# Patient Record
Sex: Male | Born: 1970
Health system: Southern US, Community
[De-identification: ages and names within clinical notes are randomized; demographics above are authoritative.]

## PROBLEM LIST (undated history)

## (undated) DIAGNOSIS — E785 Hyperlipidemia, unspecified: Secondary | ICD-10-CM

## (undated) DIAGNOSIS — K219 Gastro-esophageal reflux disease without esophagitis: Secondary | ICD-10-CM

## (undated) DIAGNOSIS — I10 Essential (primary) hypertension: Secondary | ICD-10-CM

## (undated) DIAGNOSIS — T7840XA Allergy, unspecified, initial encounter: Secondary | ICD-10-CM

## (undated) DIAGNOSIS — B351 Tinea unguium: Secondary | ICD-10-CM

## (undated) HISTORY — DX: Tinea unguium: B35.1

## (undated) HISTORY — DX: Gastro-esophageal reflux disease without esophagitis: K21.9

## (undated) HISTORY — DX: Allergy, unspecified, initial encounter: T78.40XA

## (undated) HISTORY — DX: Hyperlipidemia, unspecified: E78.5

## (undated) HISTORY — DX: Essential (primary) hypertension: I10

---

## 1999-08-14 ENCOUNTER — Emergency Department (HOSPITAL_COMMUNITY): Admission: EM | Admit: 1999-08-14 | Discharge: 1999-08-14 | Payer: Self-pay | Admitting: Emergency Medicine

## 1999-08-14 ENCOUNTER — Encounter: Payer: Self-pay | Admitting: Emergency Medicine

## 2002-08-24 ENCOUNTER — Encounter: Payer: Self-pay | Admitting: *Deleted

## 2002-08-24 ENCOUNTER — Emergency Department (HOSPITAL_COMMUNITY): Admission: EM | Admit: 2002-08-24 | Discharge: 2002-08-24 | Payer: Self-pay | Admitting: *Deleted

## 2005-12-13 ENCOUNTER — Ambulatory Visit: Payer: Self-pay | Admitting: Family Medicine

## 2005-12-13 LAB — CONVERTED CEMR LAB
ALT: 25 units/L (ref 0–40)
AST: 20 units/L (ref 0–37)
Albumin: 4 g/dL (ref 3.5–5.2)
Alkaline Phosphatase: 57 units/L (ref 39–117)
BUN: 10 mg/dL (ref 6–23)
Basophils Absolute: 0 10*3/uL (ref 0.0–0.1)
Basophils Relative: 0.2 % (ref 0.0–1.0)
CO2: 27 meq/L (ref 19–32)
Calcium: 9.2 mg/dL (ref 8.4–10.5)
Chloride: 104 meq/L (ref 96–112)
Chol/HDL Ratio, serum: 7.1
Cholesterol: 289 mg/dL (ref 0–200)
Creatinine, Ser: 1 mg/dL (ref 0.4–1.5)
Eosinophil percent: 1.4 % (ref 0.0–5.0)
GFR calc non Af Amer: 91 mL/min
Glomerular Filtration Rate, Af Am: 111 mL/min/{1.73_m2}
Glucose, Bld: 97 mg/dL (ref 70–99)
HCT: 46.9 % (ref 39.0–52.0)
HDL: 40.5 mg/dL (ref >39.0)
Hemoglobin: 15.4 g/dL (ref 13.0–17.0)
LDL DIRECT: 207.4 mg/dL
Lymphocytes Relative: 10.5 % — ABNORMAL LOW (ref 12.0–46.0)
MCHC: 32.9 g/dL (ref 30.0–36.0)
MCV: 89.3 fL (ref 78.0–100.0)
Monocytes Absolute: 0.6 10*3/uL (ref 0.2–0.7)
Monocytes Relative: 10.1 % (ref 3.0–11.0)
Neutro Abs: 4.8 10*3/uL (ref 1.4–7.7)
Neutrophils Relative %: 77.8 % — ABNORMAL HIGH (ref 43.0–77.0)
Platelets: 116 10*3/uL — ABNORMAL LOW (ref 150–400)
Potassium: 4 meq/L (ref 3.5–5.1)
RBC: 5.25 M/uL (ref 4.22–5.81)
RDW: 13.7 % (ref 11.5–14.6)
Sodium: 138 meq/L (ref 135–145)
TSH: 0.77 microintl units/mL (ref 0.35–5.50)
Total Bilirubin: 1 mg/dL (ref 0.3–1.2)
Total Protein: 7.8 g/dL (ref 6.0–8.3)
Triglyceride fasting, serum: 100 mg/dL (ref 0–149)
VLDL: 20 mg/dL (ref 0–40)
WBC: 6.2 10*3/uL (ref 4.5–10.5)

## 2006-08-04 ENCOUNTER — Ambulatory Visit: Payer: Self-pay | Admitting: Family Medicine

## 2006-10-16 ENCOUNTER — Encounter: Payer: Self-pay | Admitting: Family Medicine

## 2006-10-23 ENCOUNTER — Telehealth (INDEPENDENT_AMBULATORY_CARE_PROVIDER_SITE_OTHER): Payer: Self-pay | Admitting: *Deleted

## 2006-11-04 DIAGNOSIS — K219 Gastro-esophageal reflux disease without esophagitis: Secondary | ICD-10-CM | POA: Insufficient documentation

## 2006-11-13 ENCOUNTER — Telehealth: Payer: Self-pay | Admitting: Family Medicine

## 2006-12-19 ENCOUNTER — Ambulatory Visit: Payer: Self-pay | Admitting: Family Medicine

## 2006-12-19 DIAGNOSIS — J309 Allergic rhinitis, unspecified: Secondary | ICD-10-CM | POA: Insufficient documentation

## 2006-12-23 ENCOUNTER — Encounter: Payer: Self-pay | Admitting: Family Medicine

## 2006-12-23 LAB — CONVERTED CEMR LAB
BUN: 16 mg/dL (ref 6–23)
CO2: 31 meq/L (ref 19–32)
Calcium: 9.3 mg/dL (ref 8.4–10.5)
Chloride: 106 meq/L (ref 96–112)
Cholesterol: 281 mg/dL (ref 0–200)
Creatinine, Ser: 1 mg/dL (ref 0.4–1.5)
Direct LDL: 195.9 mg/dL
GFR calc Af Amer: 110 mL/min
GFR calc non Af Amer: 91 mL/min
Glucose, Bld: 94 mg/dL (ref 70–99)
HDL: 43.4 mg/dL (ref >39.0)
Potassium: 4.5 meq/L (ref 3.5–5.1)
Sodium: 141 meq/L (ref 135–145)
TSH: 0.71 microintl units/mL (ref 0.35–5.50)
Total CHOL/HDL Ratio: 6.5
Triglycerides: 152 mg/dL — ABNORMAL HIGH (ref 0–149)
VLDL: 30 mg/dL (ref 0–40)

## 2008-04-13 ENCOUNTER — Telehealth: Payer: Self-pay | Admitting: Family Medicine

## 2008-05-24 ENCOUNTER — Ambulatory Visit: Payer: Self-pay | Admitting: Family Medicine

## 2008-05-24 DIAGNOSIS — R498 Other voice and resonance disorders: Secondary | ICD-10-CM

## 2008-05-24 DIAGNOSIS — L732 Hidradenitis suppurativa: Secondary | ICD-10-CM

## 2008-05-24 DIAGNOSIS — E785 Hyperlipidemia, unspecified: Secondary | ICD-10-CM | POA: Insufficient documentation

## 2008-05-24 DIAGNOSIS — L910 Hypertrophic scar: Secondary | ICD-10-CM | POA: Insufficient documentation

## 2008-06-10 ENCOUNTER — Encounter: Payer: Self-pay | Admitting: Family Medicine

## 2010-06-21 ENCOUNTER — Telehealth: Payer: Self-pay | Admitting: *Deleted

## 2010-06-21 NOTE — Telephone Encounter (Signed)
Pt needs prescription mailed to home for CPX labs to be drawn at [facility].

## 2010-06-21 NOTE — Telephone Encounter (Signed)
done

## 2010-06-21 NOTE — Telephone Encounter (Signed)
rx mailed to home address Attempted to call mobile #--no longer in service Home # --no answer.

## 2010-06-22 ENCOUNTER — Telehealth: Payer: Self-pay | Admitting: *Deleted

## 2010-06-22 NOTE — Telephone Encounter (Signed)
ERROR

## 2010-07-10 ENCOUNTER — Encounter: Payer: Self-pay | Admitting: Family Medicine

## 2010-07-10 ENCOUNTER — Ambulatory Visit (INDEPENDENT_AMBULATORY_CARE_PROVIDER_SITE_OTHER): Payer: 59 | Admitting: Family Medicine

## 2010-07-10 VITALS — BP 122/86 | HR 72 | Temp 98.6°F | Ht 73.25 in | Wt 286.0 lb

## 2010-07-10 DIAGNOSIS — Z0000 Encounter for general adult medical examination without abnormal findings: Secondary | ICD-10-CM

## 2010-07-10 NOTE — Progress Notes (Signed)
  Subjective:    Patient ID: Reginald Simmons, male    DOB: 1971/01/02, 40 y.o.   MRN: [id number]  HPI 40 yr old male for a cpx. He feels fine except for some seasonal allergies causing itchy eyes and sneezing.    Review of Systems  Constitutional: Negative.   HENT: Negative.   Eyes: Negative.   Respiratory: Negative.   Cardiovascular: Negative.   Gastrointestinal: Negative.   Genitourinary: Negative.   Musculoskeletal: Negative.   Skin: Negative.   Neurological: Negative.   Hematological: Negative.   Psychiatric/Behavioral: Negative.        Objective:   Physical Exam  Constitutional: He is oriented to person, place, and time. He appears well-developed and well-nourished. No distress.  HENT:  Head: Normocephalic and atraumatic.  Right Ear: External ear normal.  Left Ear: External ear normal.  Nose: Nose normal.  Mouth/Throat: Oropharynx is clear and moist. No oropharyngeal exudate.  Eyes: Conjunctivae and EOM are normal. Pupils are equal, round, and reactive to light. Right eye exhibits no discharge. Left eye exhibits no discharge. No scleral icterus.  Neck: Neck supple. No JVD present. No tracheal deviation present. No thyromegaly present.  Cardiovascular: Normal rate, regular rhythm, normal heart sounds and intact distal pulses.  Exam reveals no gallop and no friction rub.   No murmur heard. Pulmonary/Chest: Effort normal and breath sounds normal. No respiratory distress. He has no wheezes. He has no rales. He exhibits no tenderness.  Abdominal: Soft. Bowel sounds are normal. He exhibits no distension and no mass. There is no tenderness. There is no rebound and no guarding.  Genitourinary: Penis normal. No penile tenderness.  Musculoskeletal: Normal range of motion. He exhibits no edema and no tenderness.  Lymphadenopathy:    He has no cervical adenopathy.  Neurological: He is alert and oriented to person, place, and time. He has normal reflexes. No cranial nerve deficit. He  exhibits normal muscle tone. Coordination normal.  Skin: Skin is warm and dry. No rash noted. He is not diaphoretic. No erythema. No pallor.  Psychiatric: He has a normal mood and affect. His behavior is normal. Judgment and thought content normal.          Assessment & Plan:  He is overweight, so we discussed diet and exercise.

## 2010-07-16 ENCOUNTER — Encounter: Payer: Self-pay | Admitting: Family Medicine

## 2010-07-20 NOTE — Assessment & Plan Note (Signed)
[facility] OFFICE NOTE   NAME:Reginald Simmons, Reginald Simmons                      MRN:          [id number]  DATE:12/13/2005                            DOB:          [birth year]    This is a 40 year old gentleman here for a complete physical examination.  Generally he is doing well and has no complaints.  He stays busy with a full-  time job and he and his wife recently had their fourth child.  I had treated  him in the past for asthma, but he says, over the past year, it really has  not bothered him at all.  He uses no inhalers.  Has no coughing.  No  wheezing.  No shortness of breath, et Karie Soda.  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  He  continues to take Allegra occasionally for allergies.  For other details of  his past medical history, family history, social history, habits, Melvern Banker,  refer to our last physical note dated June 17, 2003.   ALLERGIES:  None.   CURRENT MEDICATIONS:  Allegra 180 mg once a day as needed.   OBJECTIVE:  Height 6 feet 1 inch, weight 264, BP 138/80, pulse 76 and  regular.  GENERAL:  He remains overweight.  SKIN:  Clear.  EYES:  Clear sclerae.  OROPHARYNX: Clear.  NECK:  Supple without lymphadenopathy or masses.  LUNGS:  Clear.  CARDIAC:  Rate and rhythm are regular without gallops, murmurs, or rubs.  Distal pulses are full.  ABDOMEN:  Soft.  Normal bowel sounds.  Nontender.  No masses.  GENITALIA:  Normal male.  EXTREMITIES:  No cyanosis, clubbing, or edema.  NEUROLOGIC:  Grossly intact.   ASSESSMENT AND PLAN:  1. Complete physical.  We talked about increasing exercise and losing      weight.  He is fasting, so we will check the usual laboratories.  2. Allergies.  Stable.  3. Asthma, stable off of medications.      ______________________________  Tera Mater Clent Ridges, MD    SAF/MedQ  DD:  12/13/2005 DT:  12/15/2005 Job #:  [id number]

## 2011-06-25 ENCOUNTER — Telehealth: Payer: Self-pay | Admitting: *Deleted

## 2011-06-25 NOTE — Telephone Encounter (Signed)
Pt is complaining of dizziness, and clammy at work. Wants to make an appt with Dr. Clent Ridges.  Will make an appt but if any spells tonight, he needs to go to the ER.  Feels fine now and does not want to go to the ER unless he has to.

## 2011-06-26 ENCOUNTER — Ambulatory Visit (INDEPENDENT_AMBULATORY_CARE_PROVIDER_SITE_OTHER): Payer: 59 | Admitting: Family Medicine

## 2011-06-26 ENCOUNTER — Encounter: Payer: Self-pay | Admitting: Family Medicine

## 2011-06-26 VITALS — BP 124/70 | HR 74 | Temp 99.0°F | Wt 274.0 lb

## 2011-06-26 DIAGNOSIS — R55 Syncope and collapse: Secondary | ICD-10-CM

## 2011-06-26 LAB — HEPATIC FUNCTION PANEL
AST: 26 U/L (ref 0–37)
Alkaline Phosphatase: 49 U/L (ref 39–117)
Bilirubin, Direct: 0.1 mg/dL (ref 0.0–0.3)
Total Protein: 7.9 g/dL (ref 6.0–8.3)

## 2011-06-26 LAB — CBC WITH DIFFERENTIAL/PLATELET
Basophils Relative: 0.4 % (ref 0.0–3.0)
Eosinophils Relative: 0.9 % (ref 0.0–5.0)
HCT: 44.8 % (ref 39.0–52.0)
Lymphs Abs: 1.3 10*3/uL (ref 0.7–4.0)
MCV: 90.4 fl (ref 78.0–100.0)
Monocytes Absolute: 0.4 10*3/uL (ref 0.1–1.0)
Neutro Abs: 3.5 10*3/uL (ref 1.4–7.7)
RBC: 4.95 Mil/uL (ref 4.22–5.81)
WBC: 5.3 10*3/uL (ref 4.5–10.5)

## 2011-06-26 LAB — LIPID PANEL
Total CHOL/HDL Ratio: 4
Triglycerides: 101 mg/dL (ref 0.0–149.0)

## 2011-06-26 LAB — POCT URINALYSIS DIPSTICK
Bilirubin, UA: NEGATIVE
Blood, UA: NEGATIVE
Glucose, UA: NEGATIVE
Spec Grav, UA: >=1.03

## 2011-06-26 LAB — BASIC METABOLIC PANEL
CO2: 28 mEq/L (ref 19–32)
Calcium: 9.2 mg/dL (ref 8.4–10.5)
GFR: 111.97 mL/min (ref >60.00)
Potassium: 4 mEq/L (ref 3.5–5.1)
Sodium: 140 mEq/L (ref 135–145)

## 2011-06-26 NOTE — Telephone Encounter (Signed)
Make him an OV to see me

## 2011-06-26 NOTE — Telephone Encounter (Signed)
done

## 2011-06-26 NOTE — Progress Notes (Signed)
  Subjective:    Patient ID: Reginald Simmons, male    DOB: 16-Feb-1971, 41 y.o.   MRN: [id number]  HPI Here for an episode at work yesterday evening when he almost passed out. 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 He was standing talking to a coworker when he felt faint, sweaty, voices seemed far away, and he saw yellow spots on his vision. No HA or chest pain or SOB. After sitting down, drinking some fluids and eating a snack he felt better. He has been fine ever since. He admits to never eating breakfast and never snacks during the day.    Review of Systems  Constitutional: Positive for diaphoresis. Negative for fever and unexpected weight change.  Respiratory: Negative.   Cardiovascular: Negative.   Gastrointestinal: Negative.   Neurological: Positive for syncope, weakness and light-headedness. Negative for dizziness, tremors, seizures, speech difficulty, numbness and headaches.       Objective:   Physical Exam  Constitutional: He is oriented to person, place, and time. He appears well-developed and well-nourished.  Eyes: Conjunctivae are normal. Pupils are equal, round, and reactive to light.  Neck: No thyromegaly present.  Cardiovascular: Normal rate, regular rhythm, normal heart sounds and intact distal pulses.  Exam reveals no gallop and no friction rub.   No murmur heard. Pulmonary/Chest: Effort normal and breath sounds normal.  Lymphadenopathy:    He has no cervical adenopathy.  Neurological: He is alert and oriented to person, place, and time. He has normal reflexes. No cranial nerve deficit. He exhibits normal muscle tone. Coordination normal.          Assessment & Plan:  It sounds like he a vagal event. I advised him to drink plenty of fluids. He is to eat breakfast every day and have frequent snacks. Get labs today

## 2011-07-01 ENCOUNTER — Encounter: Payer: Self-pay | Admitting: Family Medicine

## 2011-07-01 MED ORDER — ATORVASTATIN CALCIUM 20 MG PO TABS: 20.0000 mg | ORAL_TABLET | Freq: Every day | ORAL | Status: DC

## 2011-07-01 NOTE — Progress Notes (Signed)
Quick Note:  I tried to reach pt by phone, no answer and I did send script e-scribe. ______

## 2011-07-01 NOTE — Progress Notes (Signed)
Addended by: Aniceto Boss A on: 07/01/2011 11:39 AM   Modules accepted: Orders

## 2011-07-18 ENCOUNTER — Encounter: Payer: 59 | Admitting: Family Medicine

## 2011-07-19 ENCOUNTER — Other Ambulatory Visit: Payer: 59

## 2011-07-26 ENCOUNTER — Ambulatory Visit (INDEPENDENT_AMBULATORY_CARE_PROVIDER_SITE_OTHER): Payer: 59 | Admitting: Family Medicine

## 2011-07-26 ENCOUNTER — Encounter: Payer: Self-pay | Admitting: Family Medicine

## 2011-07-26 VITALS — BP 124/70 | HR 77 | Temp 99.0°F | Ht 73.0 in | Wt 270.0 lb

## 2011-07-26 DIAGNOSIS — Z0000 Encounter for general adult medical examination without abnormal findings: Secondary | ICD-10-CM

## 2011-07-26 NOTE — Progress Notes (Signed)
  Subjective:    Patient ID: Reginald Simmons, male    DOB: 1971-02-16, 41 y.o.   MRN: [id number]  HPI 41 yr old male for a cpx. He feels fine with no concerns. We had planned to start him on Lipitor for an LDL of 189, but he did not want to fill this. Instead he has made a serious commitment to get healthier. He is exercising and has made major changes in his diet. He has lost 4 lbs in the past week.    Review of Systems  Constitutional: Negative.   HENT: Negative.   Eyes: Negative.   Respiratory: Negative.   Cardiovascular: Negative.   Gastrointestinal: Negative.   Genitourinary: Negative.   Musculoskeletal: Negative.   Skin: Negative.   Neurological: Negative.   Hematological: Negative.   Psychiatric/Behavioral: Negative.        Objective:   Physical Exam  Constitutional: He is oriented to person, place, and time. He appears well-developed and well-nourished. No distress.  HENT:  Head: Normocephalic and atraumatic.  Right Ear: External ear normal.  Left Ear: External ear normal.  Nose: Nose normal.  Mouth/Throat: Oropharynx is clear and moist. No oropharyngeal exudate.  Eyes: Conjunctivae and EOM are normal. Pupils are equal, round, and reactive to light. Right eye exhibits no discharge. Left eye exhibits no discharge. No scleral icterus.  Neck: Neck supple. No JVD present. No tracheal deviation present. No thyromegaly present.  Cardiovascular: Normal rate, regular rhythm, normal heart sounds and intact distal pulses.  Exam reveals no gallop and no friction rub.   No murmur heard. Pulmonary/Chest: Effort normal and breath sounds normal. No respiratory distress. He has no wheezes. He has no rales. He exhibits no tenderness.  Abdominal: Soft. Bowel sounds are normal. He exhibits no distension and no mass. There is no tenderness. There is no rebound and no guarding.  Genitourinary: Rectum normal, prostate normal and penis normal. Guaiac negative stool. No penile tenderness.    Musculoskeletal: Normal range of motion. He exhibits no edema and no tenderness.  Lymphadenopathy:    He has no cervical adenopathy.  Neurological: He is alert and oriented to person, place, and time. He has normal reflexes. No cranial nerve deficit. He exhibits normal muscle tone. Coordination normal.  Skin: Skin is warm and dry. No rash noted. He is not diaphoretic. No erythema. No pallor.  Psychiatric: He has a normal mood and affect. His behavior is normal. Judgment and thought content normal.          Assessment & Plan:  Well exam. I encouraged him to stick with his new regimen. Recheck lipids in 6 months

## 2012-01-24 ENCOUNTER — Ambulatory Visit: Payer: 59 | Admitting: Family Medicine

## 2012-01-28 ENCOUNTER — Encounter: Payer: Self-pay | Admitting: Family Medicine

## 2012-01-28 ENCOUNTER — Ambulatory Visit (INDEPENDENT_AMBULATORY_CARE_PROVIDER_SITE_OTHER): Payer: 59 | Admitting: Family Medicine

## 2012-01-28 VITALS — BP 124/80 | HR 64 | Temp 98.4°F | Wt 266.0 lb

## 2012-01-28 DIAGNOSIS — E785 Hyperlipidemia, unspecified: Secondary | ICD-10-CM

## 2012-01-28 NOTE — Progress Notes (Signed)
  Subjective:    Patient ID: KA FLAMMER, male    DOB: [birth year], 41 y.o.   MRN: [id number]  HPI Here to follow up on high lipids. For the past 6 months he has adjusted his diet and has exercised. He had lost about 15 lbs but he has gained some of that back. Today he is 4 lbs less than he was at the last visit here. He feels better and has more energy.    Review of Systems  Constitutional: Negative.   Respiratory: Negative.   Cardiovascular: Negative.        Objective:   Physical Exam  Constitutional: He appears well-developed and well-nourished.  Cardiovascular: Normal rate, regular rhythm, normal heart sounds and intact distal pulses.   Pulmonary/Chest: Effort normal and breath sounds normal.          Assessment & Plan:  We will set up fasting labs soon.

## 2012-01-31 ENCOUNTER — Other Ambulatory Visit (INDEPENDENT_AMBULATORY_CARE_PROVIDER_SITE_OTHER): Payer: 59

## 2012-01-31 DIAGNOSIS — E785 Hyperlipidemia, unspecified: Secondary | ICD-10-CM

## 2012-01-31 LAB — LIPID PANEL
Total CHOL/HDL Ratio: 4
VLDL: 20.4 mg/dL (ref 0.0–40.0)

## 2012-02-06 MED ORDER — ATORVASTATIN CALCIUM 20 MG PO TABS: 20.0000 mg | ORAL_TABLET | Freq: Every day | ORAL | Status: DC

## 2012-02-06 NOTE — Progress Notes (Signed)
Quick Note:  I tried to reach pt on cell & home and no answer or option to leave a message. I sent script e-scribe and put a copy of results in mail. ______

## 2012-09-18 ENCOUNTER — Other Ambulatory Visit (INDEPENDENT_AMBULATORY_CARE_PROVIDER_SITE_OTHER): Payer: 59

## 2012-09-18 DIAGNOSIS — Z0000 Encounter for general adult medical examination without abnormal findings: Secondary | ICD-10-CM

## 2012-09-18 DIAGNOSIS — E785 Hyperlipidemia, unspecified: Secondary | ICD-10-CM

## 2012-09-18 LAB — BASIC METABOLIC PANEL
CO2: 27 mEq/L (ref 19–32)
Chloride: 104 mEq/L (ref 96–112)
Creatinine, Ser: 0.9 mg/dL (ref 0.4–1.5)
Potassium: 4.8 mEq/L (ref 3.5–5.1)
Sodium: 138 mEq/L (ref 135–145)

## 2012-09-18 LAB — CBC WITH DIFFERENTIAL/PLATELET
Basophils Relative: 0.2 % (ref 0.0–3.0)
Eosinophils Absolute: 0.1 10*3/uL (ref 0.0–0.7)
Eosinophils Relative: 1.9 % (ref 0.0–5.0)
HCT: 44 % (ref 39.0–52.0)
Hemoglobin: 14.6 g/dL (ref 13.0–17.0)
MCHC: 33.2 g/dL (ref 30.0–36.0)
MCV: 90.2 fl (ref 78.0–100.0)
Monocytes Absolute: 0.4 10*3/uL (ref 0.1–1.0)
Neutro Abs: 2.5 10*3/uL (ref 1.4–7.7)
Neutrophils Relative %: 59.1 % (ref 43.0–77.0)
RBC: 4.88 Mil/uL (ref 4.22–5.81)
WBC: 4.2 10*3/uL — ABNORMAL LOW (ref 4.5–10.5)

## 2012-09-18 LAB — LIPID PANEL
Total CHOL/HDL Ratio: 5
Triglycerides: 78 mg/dL (ref 0.0–149.0)

## 2012-09-18 LAB — POCT URINALYSIS DIPSTICK
Bilirubin, UA: NEGATIVE
Blood, UA: NEGATIVE
Glucose, UA: NEGATIVE
Ketones, UA: NEGATIVE
Leukocytes, UA: NEGATIVE
Nitrite, UA: NEGATIVE

## 2012-09-18 LAB — HEPATIC FUNCTION PANEL
AST: 30 U/L (ref 0–37)
Bilirubin, Direct: 0.1 mg/dL (ref 0.0–0.3)
Total Bilirubin: 0.7 mg/dL (ref 0.3–1.2)

## 2012-09-18 LAB — TSH: TSH: 1.33 u[IU]/mL (ref 0.35–5.50)

## 2012-09-21 NOTE — Progress Notes (Signed)
Quick Note:  I tried to reach pt and no answer. Pt has appointment on 09/30/12 will go over then. ______

## 2012-09-30 ENCOUNTER — Ambulatory Visit (INDEPENDENT_AMBULATORY_CARE_PROVIDER_SITE_OTHER): Payer: 59 | Admitting: Family Medicine

## 2012-09-30 ENCOUNTER — Encounter: Payer: Self-pay | Admitting: Family Medicine

## 2012-09-30 VITALS — BP 120/74 | HR 84 | Temp 98.2°F | Ht 72.5 in | Wt 286.0 lb

## 2012-09-30 DIAGNOSIS — Z0000 Encounter for general adult medical examination without abnormal findings: Secondary | ICD-10-CM

## 2012-09-30 LAB — PSA: PSA: 4.08 ng/mL — ABNORMAL HIGH (ref 0.10–4.00)

## 2012-09-30 MED ORDER — MINOCYCLINE HCL 100 MG PO CAPS: 100.0000 mg | ORAL_CAPSULE | Freq: Two times a day (BID) | ORAL | Status: DC

## 2012-09-30 MED ORDER — ATORVASTATIN CALCIUM 20 MG PO TABS: 20.0000 mg | ORAL_TABLET | Freq: Every day | ORAL | Status: DC

## 2012-09-30 NOTE — Progress Notes (Signed)
  Subjective:    Patient ID: Reginald Simmons, male    DOB: 25-Jul-1970, 42 y.o.   MRN: [id number]  HPI 42 yr old male for a cpx. He feels well in general but he does ask about frequent small boils he gets on his skin. These can come up anywhere on his body and have been present for about 5 years. They get a little tender,open and drain, and then go down over the course of a week or two. He had been on Lipitor but stopped taking this last winter because he wanted to try diet alone. Predictably his LDL has jumped up dramatically.    Review of Systems  Constitutional: Negative.   HENT: Negative.   Eyes: Negative.   Respiratory: Negative.   Cardiovascular: Negative.   Gastrointestinal: Negative.   Genitourinary: Negative.   Musculoskeletal: Negative.   Skin: Negative.   Neurological: Negative.   Psychiatric/Behavioral: Negative.        Objective:   Physical Exam  Constitutional: He is oriented to person, place, and time. He appears well-developed and well-nourished. No distress.  HENT:  Head: Normocephalic and atraumatic.  Right Ear: External ear normal.  Left Ear: External ear normal.  Nose: Nose normal.  Mouth/Throat: Oropharynx is clear and moist. No oropharyngeal exudate.  Eyes: Conjunctivae and EOM are normal. Pupils are equal, round, and reactive to light. Right eye exhibits no discharge. Left eye exhibits no discharge. No scleral icterus.  Neck: Neck supple. No JVD present. No tracheal deviation present. No thyromegaly present.  Cardiovascular: Normal rate, regular rhythm, normal heart sounds and intact distal pulses.  Exam reveals no gallop and no friction rub.   No murmur heard. Pulmonary/Chest: Effort normal and breath sounds normal. No respiratory distress. He has no wheezes. He has no rales. He exhibits no tenderness.  Abdominal: Soft. Bowel sounds are normal. He exhibits no distension and no mass. There is no tenderness. There is no rebound and no guarding.  Genitourinary:  Rectum normal, prostate normal and penis normal. Guaiac negative stool. No penile tenderness.  Musculoskeletal: Normal range of motion. He exhibits no edema and no tenderness.  Lymphadenopathy:    He has no cervical adenopathy.  Neurological: He is alert and oriented to person, place, and time. He has normal reflexes. No cranial nerve deficit. He exhibits normal muscle tone. Coordination normal.  Skin: Skin is warm and dry. No rash noted. He is not diaphoretic. No erythema. No pallor.  Psychiatric: He has a normal mood and affect. His behavior is normal. Judgment and thought content normal.          Assessment & Plan:  Well exam. Start on Minocycline 100 mg bid for the skin. Get back on Lipitor and we will recheck labs in 90 days. Get a PSA today

## 2012-10-02 NOTE — Progress Notes (Signed)
Quick Note:  I left voice message with results. ______ 

## 2013-03-19 ENCOUNTER — Ambulatory Visit (INDEPENDENT_AMBULATORY_CARE_PROVIDER_SITE_OTHER): Payer: 59 | Admitting: Family Medicine

## 2013-03-19 ENCOUNTER — Encounter: Payer: Self-pay | Admitting: Family Medicine

## 2013-03-19 VITALS — BP 124/80 | HR 69 | Temp 98.4°F | Ht 72.5 in | Wt 290.0 lb

## 2013-03-19 DIAGNOSIS — Z202 Contact with and (suspected) exposure to infections with a predominantly sexual mode of transmission: Secondary | ICD-10-CM

## 2013-03-19 NOTE — Progress Notes (Signed)
Pre visit review using our clinic review tool, if applicable. No additional management support is needed unless otherwise documented below in the visit note. 

## 2013-03-19 NOTE — Progress Notes (Signed)
   Subjective:    Patient ID: Reginald Simmons, male    DOB: 07/05/1970, 43 y.o.   MRN: [id number]  HPI Here with concerns about possible STD exposure. He feels great and has no sx.    Review of Systems  Constitutional: Negative.   Respiratory: Negative.   Cardiovascular: Negative.   Genitourinary: Negative.        Objective:   Physical Exam  Constitutional: He appears well-developed and well-nourished.          Assessment & Plan:  We will test as below.

## 2013-03-20 LAB — GC/CHLAMYDIA PROBE AMP, URINE
CHLAMYDIA, SWAB/URINE, PCR: NEGATIVE
GC Probe Amp, Urine: NEGATIVE

## 2013-03-20 LAB — RPR

## 2013-03-20 LAB — HIV ANTIBODY (ROUTINE TESTING W REFLEX): HIV: NONREACTIVE

## 2013-03-22 LAB — HSV(HERPES SIMPLEX VRS) I + II AB-IGG
HSV 1 GLYCOPROTEIN G AB, IGG: 0.2 IV
HSV 2 Glycoprotein G Ab, IgG: <0.1 IV

## 2013-08-30 ENCOUNTER — Encounter: Payer: Self-pay | Admitting: Family Medicine

## 2013-08-30 ENCOUNTER — Ambulatory Visit (INDEPENDENT_AMBULATORY_CARE_PROVIDER_SITE_OTHER): Payer: 59 | Admitting: Family Medicine

## 2013-08-30 VITALS — BP 122/90 | HR 83 | Temp 98.9°F | Ht 72.5 in | Wt 286.0 lb

## 2013-08-30 DIAGNOSIS — R053 Chronic cough: Secondary | ICD-10-CM

## 2013-08-30 DIAGNOSIS — R05 Cough: Secondary | ICD-10-CM

## 2013-08-30 DIAGNOSIS — R059 Cough, unspecified: Secondary | ICD-10-CM

## 2013-08-30 MED ORDER — BECLOMETHASONE DIPROPIONATE 80 MCG/ACT IN AERS: 2.0000 | INHALATION_SPRAY | Freq: Two times a day (BID) | RESPIRATORY_TRACT | Status: DC

## 2013-08-30 NOTE — Progress Notes (Signed)
   Subjective:    Patient ID: Reginald Simmons, male    DOB: [birth year], 43 y.o.   MRN: [id number]  HPI Here for a 2 month hx of coughing, which is a dry hacking cough. Sometimes he gets hoarse with this and sometimes he gets mildly SOB. No chest pain. No swelling in the hands or feet. No GERD sx. 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 However he feels certain that breathing these fumes is the problem. He has applied to other jobs and has put in for a transfer with his company. He has never smoked.    Review of Systems  Constitutional: Negative.   Respiratory: Positive for cough, choking, chest tightness and shortness of breath. Negative for wheezing.   Cardiovascular: Negative.        Objective:   Physical Exam  Constitutional: He appears well-developed and well-nourished.  Neck: No thyromegaly present.  Cardiovascular: Normal rate, regular rhythm, normal heart sounds and intact distal pulses.   Pulmonary/Chest: Effort normal and breath sounds normal. No respiratory distress. He has no wheezes. He has no rales.  Lymphadenopathy:    He has no cervical adenopathy.          Assessment & Plan:  He may be correct about the welding fumes so I agree with trying to find another job. In the meantime try QVar bid. Get a CXR.

## 2013-08-30 NOTE — Progress Notes (Signed)
Pre visit review using our clinic review tool, if applicable. No additional management support is needed unless otherwise documented below in the visit note. 

## 2013-09-02 ENCOUNTER — Ambulatory Visit (INDEPENDENT_AMBULATORY_CARE_PROVIDER_SITE_OTHER)
Admission: RE | Admit: 2013-09-02 | Discharge: 2013-09-02 | Disposition: A | Discharge status: Home / Self Care | Payer: 59 | Source: Ambulatory Visit | Attending: Family Medicine | Admitting: Family Medicine

## 2013-09-02 DIAGNOSIS — R059 Cough, unspecified: Secondary | ICD-10-CM

## 2013-09-02 DIAGNOSIS — R05 Cough: Secondary | ICD-10-CM

## 2013-09-02 DIAGNOSIS — R053 Chronic cough: Secondary | ICD-10-CM

## 2013-09-24 ENCOUNTER — Other Ambulatory Visit: Payer: 59

## 2013-10-01 ENCOUNTER — Encounter: Payer: Self-pay | Admitting: Family Medicine

## 2013-10-01 ENCOUNTER — Ambulatory Visit (INDEPENDENT_AMBULATORY_CARE_PROVIDER_SITE_OTHER): Payer: 59 | Admitting: Family Medicine

## 2013-10-01 ENCOUNTER — Other Ambulatory Visit: Payer: 59

## 2013-10-01 VITALS — BP 120/70 | Temp 98.7°F | Ht 73.0 in | Wt 280.0 lb

## 2013-10-01 DIAGNOSIS — Z0000 Encounter for general adult medical examination without abnormal findings: Secondary | ICD-10-CM

## 2013-10-01 LAB — BASIC METABOLIC PANEL
BUN: 19 mg/dL (ref 6–23)
CO2: 27 mEq/L (ref 19–32)
CREATININE: 0.9 mg/dL (ref 0.4–1.5)
Calcium: 8.8 mg/dL (ref 8.4–10.5)
Chloride: 104 mEq/L (ref 96–112)
GFR: 113.42 mL/min (ref >60.00)
GLUCOSE: 87 mg/dL (ref 70–99)
Potassium: 3.7 mEq/L (ref 3.5–5.1)
Sodium: 138 mEq/L (ref 135–145)

## 2013-10-01 LAB — LIPID PANEL
Cholesterol: 241 mg/dL — ABNORMAL HIGH (ref 0–200)
HDL: 50.6 mg/dL (ref >39.00)
LDL Cholesterol: 174 mg/dL — ABNORMAL HIGH (ref 0–99)
NonHDL: 190.4
Total CHOL/HDL Ratio: 5
Triglycerides: 80 mg/dL (ref 0.0–149.0)
VLDL: 16 mg/dL (ref 0.0–40.0)

## 2013-10-01 LAB — HEPATIC FUNCTION PANEL
ALBUMIN: 3.8 g/dL (ref 3.5–5.2)
ALK PHOS: 53 U/L (ref 39–117)
ALT: 27 U/L (ref 0–53)
AST: 21 U/L (ref 0–37)
Bilirubin, Direct: 0.1 mg/dL (ref 0.0–0.3)
TOTAL PROTEIN: 7.1 g/dL (ref 6.0–8.3)
Total Bilirubin: 0.5 mg/dL (ref 0.2–1.2)

## 2013-10-01 LAB — POCT URINALYSIS DIPSTICK
BILIRUBIN UA: NEGATIVE
Blood, UA: NEGATIVE
Glucose, UA: NEGATIVE
Ketones, UA: NEGATIVE
LEUKOCYTES UA: NEGATIVE
NITRITE UA: NEGATIVE
Protein, UA: NEGATIVE
Spec Grav, UA: 1.02
Urobilinogen, UA: 0.2
pH, UA: 5.5

## 2013-10-01 LAB — PSA: PSA: 3.23 ng/mL (ref 0.10–4.00)

## 2013-10-01 LAB — TSH: TSH: 1.42 u[IU]/mL (ref 0.35–4.50)

## 2013-10-01 NOTE — Progress Notes (Signed)
Pre visit review using our clinic review tool, if applicable. No additional management support is needed unless otherwise documented below in the visit note. 

## 2013-10-01 NOTE — Progress Notes (Signed)
   Subjective:    Patient ID: Reginald Simmons, male    DOB: 1970-12-20, 43 y.o.   MRN: [id number]  HPI 43 yr old male for a cpx. He feels well. 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    Review of Systems  Constitutional: Negative.   HENT: Negative.   Eyes: Negative.   Respiratory: Negative.   Cardiovascular: Negative.   Gastrointestinal: Negative.   Genitourinary: Negative.   Musculoskeletal: Negative.   Skin: Negative.   Neurological: Negative.   Psychiatric/Behavioral: Negative.        Objective:   Physical Exam  Constitutional: He is oriented to person, place, and time. He appears well-developed and well-nourished. No distress.  HENT:  Head: Normocephalic and atraumatic.  Right Ear: External ear normal.  Left Ear: External ear normal.  Nose: Nose normal.  Mouth/Throat: Oropharynx is clear and moist. No oropharyngeal exudate.  Eyes: Conjunctivae and EOM are normal. Pupils are equal, round, and reactive to light. Right eye exhibits no discharge. Left eye exhibits no discharge. No scleral icterus.  Neck: Neck supple. No JVD present. No tracheal deviation present. No thyromegaly present.  Cardiovascular: Normal rate, regular rhythm, normal heart sounds and intact distal pulses.  Exam reveals no gallop and no friction rub.   No murmur heard. Pulmonary/Chest: Effort normal and breath sounds normal. No respiratory distress. He has no wheezes. He has no rales. He exhibits no tenderness.  Abdominal: Soft. Bowel sounds are normal. He exhibits no distension and no mass. There is no tenderness. There is no rebound and no guarding.  Genitourinary: Rectum normal, prostate normal and penis normal. Guaiac negative stool. No penile tenderness.  Musculoskeletal: Normal range of motion. He exhibits no edema and no tenderness.  Lymphadenopathy:    He has no cervical adenopathy.  Neurological: He is alert and oriented to person, place, and time. He  has normal reflexes. No cranial nerve deficit. He exhibits normal muscle tone. Coordination normal.  Skin: Skin is warm and dry. No rash noted. He is not diaphoretic. No erythema. No pallor.  Psychiatric: He has a normal mood and affect. His behavior is normal. Judgment and thought content normal.          Assessment & Plan:  Well exam.

## 2013-10-01 NOTE — Addendum Note (Signed)
Addended by: Joyce Gross R on: 10/01/2013 11:39 AM   Modules accepted: Orders

## 2014-04-28 ENCOUNTER — Other Ambulatory Visit: Payer: Self-pay | Admitting: Family Medicine

## 2014-04-29 ENCOUNTER — Ambulatory Visit (INDEPENDENT_AMBULATORY_CARE_PROVIDER_SITE_OTHER): Payer: 59 | Admitting: Family Medicine

## 2014-04-29 ENCOUNTER — Encounter: Payer: Self-pay | Admitting: Family Medicine

## 2014-04-29 VITALS — BP 128/70 | HR 68 | Temp 98.6°F | Ht 73.0 in | Wt 289.0 lb

## 2014-04-29 DIAGNOSIS — L0293 Carbuncle, unspecified: Secondary | ICD-10-CM

## 2014-04-29 MED ORDER — DOXYCYCLINE HYCLATE 100 MG PO CAPS: 100.0000 mg | ORAL_CAPSULE | Freq: Two times a day (BID) | ORAL | Status: DC

## 2014-04-29 NOTE — Progress Notes (Signed)
Pre visit review using our clinic review tool, if applicable. No additional management support is needed unless otherwise documented below in the visit note. 

## 2014-04-29 NOTE — Progress Notes (Signed)
   Subjective:    Patient ID: Reginald Simmons, male    DOB: 03/26/1970, 44 y.o.   MRN: [id number]  HPI Here for several boils that came up recently. He had one on the abdomen a few weeks ago and this week he had one on the scalp. They usually open and drain, and then go back down. He has used Minopcycline in the past on a prn basis but has never taken anything on a maintenance basis.    Review of Systems  Constitutional: Negative.        Objective:   Physical Exam  Constitutional: He appears well-developed and well-nourished.  Lymphadenopathy:    He has no cervical adenopathy.  Skin:  Cystic lesion on the left parietal scalp which has drained and is now flat           Assessment & Plan:  We will start him on prophylactic Doxycycline 100 mg bid for the next 90 days. Recheck prn

## 2014-09-30 ENCOUNTER — Other Ambulatory Visit (INDEPENDENT_AMBULATORY_CARE_PROVIDER_SITE_OTHER): Payer: 59

## 2014-09-30 DIAGNOSIS — Z0000 Encounter for general adult medical examination without abnormal findings: Secondary | ICD-10-CM

## 2014-09-30 LAB — LIPID PANEL
CHOL/HDL RATIO: 5
CHOLESTEROL: 265 mg/dL — AB (ref 0–200)
HDL: 52.2 mg/dL (ref >39.00)
LDL Cholesterol: 193 mg/dL — ABNORMAL HIGH (ref 0–99)
NONHDL: 213.16
Triglycerides: 101 mg/dL (ref 0.0–149.0)
VLDL: 20.2 mg/dL (ref 0.0–40.0)

## 2014-09-30 LAB — POCT URINALYSIS DIPSTICK
Bilirubin, UA: NEGATIVE
GLUCOSE UA: NEGATIVE
Ketones, UA: NEGATIVE
Leukocytes, UA: NEGATIVE
Nitrite, UA: NEGATIVE
PH UA: 6
Protein, UA: NEGATIVE
RBC UA: NEGATIVE
SPEC GRAV UA: 1.025
Urobilinogen, UA: 0.2

## 2014-09-30 LAB — BASIC METABOLIC PANEL
BUN: 16 mg/dL (ref 6–23)
CALCIUM: 8.9 mg/dL (ref 8.4–10.5)
CO2: 27 mEq/L (ref 19–32)
CREATININE: 0.92 mg/dL (ref 0.40–1.50)
Chloride: 105 mEq/L (ref 96–112)
GFR: 115.72 mL/min (ref >60.00)
GLUCOSE: 99 mg/dL (ref 70–99)
Potassium: 3.9 mEq/L (ref 3.5–5.1)
Sodium: 139 mEq/L (ref 135–145)

## 2014-09-30 LAB — HEPATIC FUNCTION PANEL
ALT: 31 U/L (ref 0–53)
AST: 22 U/L (ref 0–37)
Albumin: 4.1 g/dL (ref 3.5–5.2)
Alkaline Phosphatase: 63 U/L (ref 39–117)
BILIRUBIN TOTAL: 0.7 mg/dL (ref 0.2–1.2)
Bilirubin, Direct: 0.1 mg/dL (ref 0.0–0.3)
Total Protein: 7.3 g/dL (ref 6.0–8.3)

## 2014-09-30 LAB — CBC WITH DIFFERENTIAL/PLATELET
BASOS ABS: 0 10*3/uL (ref 0.0–0.1)
BASOS PCT: 0.4 % (ref 0.0–3.0)
EOS PCT: 1.3 % (ref 0.0–5.0)
Eosinophils Absolute: 0.1 10*3/uL (ref 0.0–0.7)
HCT: 46.1 % (ref 39.0–52.0)
Hemoglobin: 15.3 g/dL (ref 13.0–17.0)
LYMPHS PCT: 26.4 % (ref 12.0–46.0)
Lymphs Abs: 1.2 10*3/uL (ref 0.7–4.0)
MCHC: 33.1 g/dL (ref 30.0–36.0)
MCV: 89.2 fl (ref 78.0–100.0)
MONOS PCT: 11 % (ref 3.0–12.0)
Monocytes Absolute: 0.5 10*3/uL (ref 0.1–1.0)
Neutro Abs: 2.7 10*3/uL (ref 1.4–7.7)
Neutrophils Relative %: 60.9 % (ref 43.0–77.0)
Platelets: 132 10*3/uL — ABNORMAL LOW (ref 150.0–400.0)
RBC: 5.17 Mil/uL (ref 4.22–5.81)
RDW: 14.7 % (ref 11.5–15.5)
WBC: 4.4 10*3/uL (ref 4.0–10.5)

## 2014-09-30 LAB — TSH: TSH: 0.84 u[IU]/mL (ref 0.35–4.50)

## 2014-09-30 LAB — PSA: PSA: 2.72 ng/mL (ref 0.10–4.00)

## 2014-10-05 MED ORDER — ATORVASTATIN CALCIUM 20 MG PO TABS
20.0000 mg | ORAL_TABLET | Freq: Every day | ORAL | Status: DC
Start: 2014-10-05 — End: 2016-11-01

## 2014-10-05 NOTE — Addendum Note (Signed)
Addended by: Colleen Can on: 10/05/2014 02:57 PM   Modules accepted: Orders

## 2014-10-07 ENCOUNTER — Encounter: Payer: 59 | Admitting: Family Medicine

## 2014-10-21 ENCOUNTER — Ambulatory Visit (INDEPENDENT_AMBULATORY_CARE_PROVIDER_SITE_OTHER): Payer: 59 | Admitting: Family Medicine

## 2014-10-21 ENCOUNTER — Encounter: Payer: Self-pay | Admitting: Family Medicine

## 2014-10-21 VITALS — BP 120/80 | HR 66 | Temp 98.8°F | Ht 72.75 in | Wt 290.0 lb

## 2014-10-21 DIAGNOSIS — L723 Sebaceous cyst: Secondary | ICD-10-CM

## 2014-10-21 DIAGNOSIS — Z23 Encounter for immunization: Secondary | ICD-10-CM

## 2014-10-21 DIAGNOSIS — Z0000 Encounter for general adult medical examination without abnormal findings: Secondary | ICD-10-CM

## 2014-10-21 DIAGNOSIS — E785 Hyperlipidemia, unspecified: Secondary | ICD-10-CM | POA: Diagnosis not present

## 2014-10-21 NOTE — Progress Notes (Signed)
   Subjective:    Patient ID: Reginald Simmons, male    DOB: 1970-04-21, 44 y.o.   MRN: [id number]  HPI 44 yr old male for a cpx. He feels well but he does want to take care of the cyst on is scalp. He has had a sebaceous cyst on the scalp for several years and this occasionally gets infected. It drains pus and has an odor, then it closes up again. Otherwise he has started on Lipitor for his cholesterol. 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    Review of Systems  Constitutional: Negative.   HENT: Negative.   Eyes: Negative.   Respiratory: Negative.   Cardiovascular: Negative.   Gastrointestinal: Negative.   Genitourinary: Negative.   Musculoskeletal: Negative.   Skin: Negative.   Neurological: Negative.   Psychiatric/Behavioral: Negative.        Objective:   Physical Exam  Constitutional: He is oriented to person, place, and time. He appears well-developed and well-nourished. No distress.  HENT:  Head: Normocephalic and atraumatic.  Right Ear: External ear normal.  Left Ear: External ear normal.  Nose: Nose normal.  Mouth/Throat: Oropharynx is clear and moist. No oropharyngeal exudate.  Eyes: Conjunctivae and EOM are normal. Pupils are equal, round, and reactive to light. Right eye exhibits no discharge. Left eye exhibits no discharge. No scleral icterus.  Neck: Neck supple. No JVD present. No tracheal deviation present. No thyromegaly present.  Cardiovascular: Normal rate, regular rhythm, normal heart sounds and intact distal pulses.  Exam reveals no gallop and no friction rub.   No murmur heard. Pulmonary/Chest: Effort normal and breath sounds normal. No respiratory distress. He has no wheezes. He has no rales. He exhibits no tenderness.  Abdominal: Soft. Bowel sounds are normal. He exhibits no distension and no mass. There is no tenderness. There is no rebound and no guarding.  Genitourinary: Rectum normal,  prostate normal and penis normal. Guaiac negative stool. No penile tenderness.  Musculoskeletal: Normal range of motion. He exhibits no edema or tenderness.  Lymphadenopathy:    He has no cervical adenopathy.  Neurological: He is alert and oriented to person, place, and time. He has normal reflexes. No cranial nerve deficit. He exhibits normal muscle tone. Coordination normal.  Skin: Skin is warm and dry. No rash noted. He is not diaphoretic. No erythema. No pallor.  There is a large non-tender sebaceous cyst on the left parietal scalp   Psychiatric: He has a normal mood and affect. His behavior is normal. Judgment and thought content normal.          Assessment & Plan:  Well exam. We discussed diet and exercise advice so he can lose weight. We will recheck his lipids in 90 days. Refer to Dermatology to remove the scalp cyst.

## 2014-10-21 NOTE — Progress Notes (Signed)
Pre visit review using our clinic review tool, if applicable. No additional management support is needed unless otherwise documented below in the visit note. 

## 2015-06-02 ENCOUNTER — Telehealth: Payer: Self-pay | Admitting: Family Medicine

## 2015-06-05 ENCOUNTER — Telehealth: Payer: Self-pay | Admitting: Family Medicine

## 2015-06-05 ENCOUNTER — Encounter: Payer: Self-pay | Admitting: Family Medicine

## 2015-06-05 ENCOUNTER — Ambulatory Visit (INDEPENDENT_AMBULATORY_CARE_PROVIDER_SITE_OTHER): Payer: 59 | Admitting: Family Medicine

## 2015-06-05 VITALS — BP 110/80 | HR 79 | Temp 99.5°F | Ht 72.75 in | Wt 303.0 lb

## 2015-06-05 DIAGNOSIS — S161XXA Strain of muscle, fascia and tendon at neck level, initial encounter: Secondary | ICD-10-CM | POA: Diagnosis not present

## 2015-06-05 MED ORDER — HYDROCODONE-ACETAMINOPHEN 10-325 MG PO TABS: 1.0000 | ORAL_TABLET | Freq: Three times a day (TID) | ORAL | Status: DC | PRN

## 2015-06-05 MED ORDER — CYCLOBENZAPRINE HCL 10 MG PO TABS: 10.0000 mg | ORAL_TABLET | Freq: Three times a day (TID) | ORAL | Status: DC | PRN

## 2015-06-05 MED ORDER — PREDNISONE 10 MG PO TABS: ORAL_TABLET | ORAL | Status: DC
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
## 2015-06-05 NOTE — Telephone Encounter (Signed)
[facility] Night - Client TELEPHONE [facility] Patient Name: Reginald Simmons Gender: Male DOB: Aug 03, 1970 Age: 45 Y 2 M 26 D Return Phone Number: [phone] (Primary) Address: City/State/Zip: [place] Client [facility] - Client Client Site [facility] - Night Physician [place] Type Call Who Is Calling Patient / Member / Family / Caregiver Call Type Triage / Clinical Relationship To Patient Self Return Phone Number [phone] (Primary) Chief Complaint Neck Injury Reason for Call Symptomatic / Request for Health Information Initial Comment Neck pain after being on roller-coaster PreDisposition Go to ED Translation No Nurse Assessment Nurse: Zenia Resides, RN, Shirlee Limerick Date/Time Eilene Ghazi Time): 06/02/2015 5:27:03 PM Confirm and document reason for call. If symptomatic, describe symptoms. You must click the next button to save text entered. ---Caller states he is experiencing neck pain after being on rollercoaster today. States a quick jerk/turn is what caused the pain. Unable to turn neck all the way to the left or right. Has the patient traveled out of the country within the last 30 days? ---Not Applicable Does the patient have any new or worsening symptoms? ---Yes Will a triage be completed? ---Yes Related visit to physician within the last 2 weeks? ---N/A Does the PT have any chronic conditions? (i.e. diabetes, asthma, etc.) ---Yes List chronic conditions. ---high cholesterol, allergies (seasonal), Is this a behavioral health or substance abuse call? ---No Guidelines Guideline Title Affirmed Question Affirmed Notes Nurse Date/Time (Eastern Time) Neck Injury Neck pain or stiffness from lifting, bending or twisting injury Shea Evans 06/02/2015 5:31:22 PM Disp. Time Eilene Ghazi Time) Disposition Final User 06/02/2015 5:41:36 [place], RN, Abran Duke  Understands: Yes PLEASE NOTE: All timestamps contained within this report are represented as [place] Standard Time. CONFIDENTIALTY NOTICE: This fax transmission is intended only for the addressee. It contains information that is legally privileged, confidential or otherwise protected from use or disclosure. If you are not the intended recipient, you are strictly prohibited from reviewing, disclosing, copying using or disseminating any of this information or taking any action in reliance on or regarding this information. If you have received this fax in error, please notify us immediately by telephone so that we can arrange for its return to [place]. Phone: [phone], Toll-Free: [phone], Fax: [phone] Page: 2 of 2 Call Id: [id number] Disagree/Comply: Comply Care Advice Given Per Guideline HOME CARE: You should be able to treat this at home. REASSURANCE - BENDING OR TWISTING INJURY (STRAIN, SPRAIN): * Strain and sprain are the medical terms used to describe over-stretching of the muscles and ligaments of the neck. A twisting or bending injury can cause neck strain and sprain. * The main symptom is pain that gets worse with movement. * Here is some care advice that should help. LOCAL COLD: * Apply a cold pack or an ice bag (wrapped in a moist towel) to the area for 20 minutes. Repeat in 1 hour, then every 4 hours while awake. LOCAL HEAT: * Beginning 48 hours after an injury, apply a warm washcloth or heating pad for 10 minutes three times a day. * This will help increase circulation and improve healing. PAIN MEDICINES: * For pain relief, take acetaminophen, ibuprofen, or naproxen. * Use the lowest amount that makes your pain feel better. CAUTION - NSAIDS (E.G., IBUPROFEN, NAPROXEN): * Do not take nonsteroidal anti-inflammatory drugs (NSAIDs) if you have stomach problems, kidney disease, heart failure, or other contraindications to using this type of medication. *  Do not take NSAID medications for  over 7 days without consulting your PCP. * You may take this medicine with or without food. Taking it with food or milk may lessen the chance the drug will upset your stomach. * GASTROINTESTINAL RISK: There is an increased risk of stomach ulcers, GI bleeding, perforation. * CARDIOVASCULAR RISK: There may be an increased risk of heart attack and stroke. AVOID: Avoid heavy lifting and any sports activities for the first week after an injury. SOFT COLLARS: Some patients find that their necks feel better when they use a soft collar. Generally, soft collars should not be used for more than a couple days. * For minor injuries, pain should improve over a 2-3 day period and disappear within 7 days. EXPECTED COURSE: * The pain from more significant neck injuries may take longer to subside. The pain from a 'whiplash' injury often resolves within 1-2 weeks. However, some patients (10-40%) may go on to have pain for weeks to months. CALL BACK IF: * Pain not improving after 72 hours * Pain lasts over 7 days * You develop numbness or weakness in your arms or legs * You become worse. CARE ADVICE given per Neck Injury (Adult) guideline.

## 2015-06-05 NOTE — Progress Notes (Signed)
Pre visit review using our clinic review tool, if applicable. No additional management support is needed unless otherwise documented below in the visit note. 

## 2015-06-05 NOTE — Telephone Encounter (Signed)
Per Dr. Sarajane Jews we can see pt today. I spoke with pt and he is on the schedule for this morning to see Dr. Sarajane Jews.

## 2015-06-05 NOTE — Telephone Encounter (Signed)
The patient needs a recommendation to a chiropractor.

## 2015-06-05 NOTE — Telephone Encounter (Signed)
I spoke with pt and he is on the schedule to see Dr. Sarajane Jews today.

## 2015-06-07 DIAGNOSIS — Z7689 Persons encountering health services in other specified circumstances: Secondary | ICD-10-CM

## 2015-06-08 ENCOUNTER — Telehealth: Payer: Self-pay | Admitting: Family Medicine

## 2015-06-08 DIAGNOSIS — S161XXA Strain of muscle, fascia and tendon at neck level, initial encounter: Secondary | ICD-10-CM

## 2015-06-08 NOTE — Telephone Encounter (Signed)
Pt is still having a lot of neck pain and the pain medications is really not helping.  Pt states he is not able to do his job and is to return to work on Monday 4/10.  Would like a call back today.

## 2015-06-08 NOTE — Telephone Encounter (Signed)
Spoke to the pt.  He is taking the hydrocodone every 8 hours and the flexeril taking 3 X daily.  Also doing the tapering dose of prednisone.

## 2015-06-08 NOTE — Telephone Encounter (Signed)
Pt also applying heat.  Informed him that I will send to Dr. Sarajane Jews.  Advised him to continue as instructed by Dr and await further instructions.

## 2015-06-09 NOTE — Telephone Encounter (Signed)
Per Dr. Sarajane Jews okay to give a note from 06/05/2015 through 06/19/2015. I wrote note and spoke with pt, ready for pick up.

## 2015-06-09 NOTE — Telephone Encounter (Signed)
I referred him to PT, and I think this will help a lot. We can extend his work note if needed

## 2015-06-09 NOTE — Telephone Encounter (Signed)
Pt needs to have work note to extend his time out of work.

## 2015-06-19 ENCOUNTER — Ambulatory Visit: Payer: 59 | Attending: Family Medicine

## 2015-06-19 ENCOUNTER — Telehealth: Payer: Self-pay | Admitting: Family Medicine

## 2015-06-19 DIAGNOSIS — M542 Cervicalgia: Secondary | ICD-10-CM | POA: Diagnosis present

## 2015-06-19 DIAGNOSIS — R252 Cramp and spasm: Secondary | ICD-10-CM | POA: Diagnosis present

## 2015-06-19 DIAGNOSIS — R293 Abnormal posture: Secondary | ICD-10-CM | POA: Diagnosis present

## 2015-06-19 NOTE — Telephone Encounter (Signed)
Pt is here now to pick up note and is aware of below message.

## 2015-06-19 NOTE — Telephone Encounter (Signed)
Patient states that his physical therapist has ordered more visits and doesn't want him to return to work yet.  Patient was supposed to go back to work tomorrow, so if he doesn't he will need an extended work note than the one that Dr Sarajane Jews has currently wrote for him.  He needs this note to give to work today since he was supposed to go back tomorrow, 06/20/15.

## 2015-06-19 NOTE — Therapy (Signed)
[facility] [address], [place], [place], [address] Phone: [phone]   Fax:  [phone]  Physical Therapy Evaluation  Patient Details  Name: Reginald Simmons MRN: [id number] Date of Birth: 1970/07/07 Referring Provider: Alysia Penna, MD  Encounter Date: 06/19/2015      PT End of Session - 06/19/15 0912    Visit Number 1   Date for PT Re-Evaluation 08/14/15   PT Start Time [id number]   PT Stop Time 0921   PT Time Calculation (min) 34 min   Activity Tolerance Patient tolerated treatment well   Behavior During Therapy [facility] for tasks assessed/performed      Past Medical History  Diagnosis Date  . Hyperlipidemia   . GERD (gastroesophageal reflux disease)   . Allergy   . Onychomycosis     History reviewed. No pertinent past surgical history.  There were no vitals filed for this visit.       Subjective Assessment - 06/19/15 0852    Subjective Pt presents to PT with complaints of neck pain that began after riding a roller coaster 06/02/15.  Pt reports that pain is gradually improving with use of steroids, pain meds and heat.  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     Limitations Sitting   How long can you sit comfortably? pain with computer work.  Not limited to time, just aggravates symptoms   Diagnostic tests none   Patient Stated Goals reduce neck pain, not able to exercise, return to work, turn head with driving   Currently in Pain? Yes   Pain Score 5    Pain Location Neck   Pain Orientation Right   Pain Descriptors / Indicators Sore   Pain Type Acute pain   Pain Onset 1 to 4 weeks ago   Pain Frequency Intermittent   Aggravating Factors  sitting in one position, turning head with driving   Pain Relieving Factors not moving head, head, rest, muscle relaxers            [facility] PT Assessment - 06/19/15 0001    Assessment   Medical Diagnosis neck strain, initial encounter   Referring  Provider Alysia Penna, MD   Onset Date/Surgical Date 06/02/15   Next MD Visit this week   Prior Therapy none   Precautions   Precautions None   Restrictions   Weight Bearing Restrictions No   Balance Screen   Has the patient fallen in the past 6 months No   Has the patient had a decrease in activity level because of a fear of falling?  No   Is the patient reluctant to leave their home because of a fear of falling?  No   Home Ecologist residence   Prior Function   Level of Independence Independent   Vocation Full time employment   [employer]   Cognition   Overall Cognitive Status Within Functional Limits for tasks assessed   Observation/Other Assessments   Focus on Therapeutic Outcomes (FOTO)  57% limitation   Posture/Postural Control   Posture/Postural Control No significant limitations   ROM / Strength   AROM / PROM / Strength AROM;Strength;PROM   AROM   Overall AROM  Deficits   Overall AROM Comments UE AROM is full    AROM Assessment Site Cervical   Cervical Flexion 10   Cervical Extension 20   Cervical - Right Side Bend 25   Cervical -  Left Side Bend 15   Cervical - Right Rotation 45   Cervical - Left Rotation 35   PROM   Overall PROM  Deficits   Overall PROM Comments Lt rotation and sidebending limited by 25% vs the Rt motion- pain reported   Strength   Overall Strength Within functional limits for tasks performed   Overall Strength Comments 5/5 bil UE strength   Palpation   Spinal mobility reduced PA mobility b y25% C3-T2   Palpation comment Tension and active trigger points over Rt UT, SCM, cervical paraspinals and scalenes.  No pain over Lt side of neck                           PT Education - 06/19/15 0912    Education provided Yes   Education Details HEP and postural education   Person(s) Educated Patient   Methods Explanation;Demonstration;Handout   Comprehension Verbalized  understanding;Returned demonstration          PT Short Term Goals - 06/19/15 0916    PT SHORT TERM GOAL #1   Title be independent in initial HEP   Time 4   Period Weeks   Status New   PT SHORT TERM GOAL #2   Title report a 30% reduction in neck pain with looking up/down to allow for return to work   Time 4   Period Weeks   Status New   PT SHORT TERM GOAL #3   Title demonstrate cervical AROM rotation to 50 degrees to improve ability to look behind with driving   Time 4   Period Weeks   Status New   PT SHORT TERM GOAL #4   Title verbalize and demonstrate correct posture    Time 4   Period Weeks   Status New           PT Long Term Goals - 06/19/15 0847    PT LONG TERM GOAL #1   Title be independent in advanced HEP   Time 8   Period Weeks   Status New   PT LONG TERM GOAL #2   Title reduce FOTO to < or = to 33% limitation   Time 8   Period Weeks   Status New   PT LONG TERM GOAL #3   Title demonstrate full cervical AROM in all directions to allow for return to work and for safety with driving   Time 8   Period Weeks   Status New   PT LONG TERM GOAL #4   Title report a 70% reduction in neck pain with ADLs, driving and work tasks   Time 8   Period Weeks   Status New               Plan - 06/19/15 0913    Clinical Impression Statement Pt presents to PT with neck pain sustained while riding a roller coaster 06/02/15.  Pt has been out of work as a Building control surveyor since the injury.  Pt has been treating pain with muscle relaxers, pain medication, heat and rest.  Pt demonstrates limited cervical AROM to the Lt>Rt, active trigger points in Rt neck and postural abnormality.  Pt will benefit from skilled PT for manual, flexibility, postural strength and modalities as needed.     Rehab Potential Good   PT Frequency 2x / week   PT Duration 8 weeks   PT Next Visit Plan flexibility, manual for trigger point release. postural strength, modalities.  Consider  dry needling next  week.   Consulted and Agree with Plan of Care Patient      Patient will benefit from skilled therapeutic intervention in order to improve the following deficits and impairments:  Postural dysfunction, Impaired flexibility, Improper body mechanics, Pain, Decreased activity tolerance, Increased muscle spasms, Decreased range of motion  Visit Diagnosis: Cervicalgia - Plan: PT plan of care cert/re-cert  Abnormal posture - Plan: PT plan of care cert/re-cert  Cramp and spasm - Plan: PT plan of care cert/re-cert     Problem List Patient Active Problem List   Diagnosis Date Noted  . Hyperlipidemia 05/24/2008  . KELOID SCAR 05/24/2008  . HIDRADENITIS SUPPURATIVA 05/24/2008  . HOARSENESS 05/24/2008  . ALLERGIC RHINITIS 12/19/2006  . GERD 11/04/2006    Sigurd Sos, PT 06/19/2015 9:27 AM  [facility] [address] [place], [place], [address] Phone: [phone]   Fax:  [phone]  Name: Reginald Simmons MRN: [id number] Date of Birth: May 31, 1970

## 2015-06-19 NOTE — Patient Instructions (Signed)
PERFORM ALL EXERCISES GENTLY AND WITH GOOD POSTURE.    20 SECOND HOLD, 3 REPS TO EACH SIDE. 4-5 TIMES EACH DAY.   AROM: Neck Rotation   Turn head slowly to look over one shoulder, then the other.   AROM: Neck Flexion   Bend head forward.   AROM: Lateral Neck Flexion   Slowly tilt head toward one shoulder, then the other.    Posture - Standing   Good posture is important. Avoid slouching and forward head thrust. Maintain curve in low back and align ears over shoulders, hips over ankles.  Pull your belly button in toward your back bone. Posture Tips DO: - stand tall and erect - keep chin tucked in - keep head and shoulders in alignment - check posture regularly in mirror or large window - pull head back against headrest in car seat;  Change your position often.  Sit with lumbar support. DON'T: - slouch or slump while watching TV or reading - sit, stand or lie in one position  for too long;  Sitting is especially hard on the spine so if you sit at a desk/use the computer, then stand up often! Copyright  VHI. All rights reserved.  Posture - Sitting  Sit upright, head facing forward. Try using a roll to support lower back. Keep shoulders relaxed, and avoid rounded back. Keep hips level with knees. Avoid crossing legs for long periods. Copyright  VHI. All rights reserved.  Chronic neck strain can develop because of poor posture and faulty work habits  Postural strain related to slumped sitting and forward head posture is a leading cause of headaches, neck and upper back pain  General strengthening and flexibility exercises are helpful in the treatment of neck pain.  Most importantly, you should learn to correct the posture that may be contributing to chronic pain.   Change positions frequently  Change your work or home environment to improve posture and mechanics.   Reginald Simmons, PT 06/19/2015 9:12 AM

## 2015-06-19 NOTE — Telephone Encounter (Signed)
Per Dr. Sarajane Jews okay to extend note.

## 2015-06-19 NOTE — Telephone Encounter (Signed)
Per Dr. Sarajane Jews give note for 06/20/2015 - 06/25/2015 and if pt is unable to return to work on Monday, then he will need a office visit.

## 2015-06-19 NOTE — Telephone Encounter (Signed)
Pt would like to pick up note today. Pt would like to know how long Dr Sarajane Jews will have him out. Pt states his physical therapy appointments are scheduled until May 25. Pt states he cannot do his work right now and needs Dr Sarajane Jews to advise on how long he should be out. Pt needs to know something asap to let employer know.

## 2015-06-21 ENCOUNTER — Ambulatory Visit: Payer: 59 | Admitting: Physical Therapy

## 2015-06-21 ENCOUNTER — Encounter: Payer: Self-pay | Admitting: Physical Therapy

## 2015-06-21 DIAGNOSIS — M542 Cervicalgia: Secondary | ICD-10-CM

## 2015-06-21 DIAGNOSIS — R252 Cramp and spasm: Secondary | ICD-10-CM

## 2015-06-21 DIAGNOSIS — R293 Abnormal posture: Secondary | ICD-10-CM

## 2015-06-21 NOTE — Therapy (Signed)
[facility] [address] [place], [place], [address] Phone: [phone]   Fax:  [phone]  Physical Therapy Treatment  Patient Details  Name: Reginald Simmons MRN: [id number] Date of Birth: 1970/07/22 Referring Provider: Alysia Penna, MD  Encounter Date: 06/21/2015      PT End of Session - 06/21/15 0908    Visit Number 2   Date for PT Re-Evaluation 08/14/15   PT Start Time [id number]   PT Stop Time 0948   PT Time Calculation (min) 64 min   Activity Tolerance Patient tolerated treatment well   Behavior During Therapy [facility] for tasks assessed/performed      Past Medical History  Diagnosis Date  . Hyperlipidemia   . GERD (gastroesophageal reflux disease)   . Allergy   . Onychomycosis     History reviewed. No pertinent past surgical history.  There were no vitals filed for this visit.      Subjective Assessment - 06/21/15 0901    Subjective Pt with neck pain Rt side rated as 5/10. Pt reports compliance with HEP   Pertinent History Neck pain began after riding roller coaster 06/02/2015. 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 pain with computer work.  Not limited to time, just aggravates symptoms   Diagnostic tests none   Patient Stated Goals reduce neck pain, not able to exercise, return to work, turn head with driving   Currently in Pain? Yes   Pain Score 5    Pain Location Neck   Pain Orientation Right   Pain Descriptors / Indicators Sore   Pain Type Acute pain   Pain Onset 1 to 4 weeks ago   Pain Frequency Intermittent   Aggravating Factors  sitting in one position, turning head with driving   Pain Relieving Factors not moving head, rest, muscle relaxers   Multiple Pain Sites No                         [facility] Adult PT Treatment/Exercise - 06/21/15 0001    Posture/Postural Control   Posture/Postural Control No significant limitations    Exercises   Exercises Neck;Shoulder   Neck Exercises: Machines for Strengthening   UBE (Upper Arm Bike) L 2 65min (3/3)   Neck Exercises: Seated   Cervical Rotation Both;5 reps  limited by pain, improved at end of session   Lateral Flexion Both;5 reps  limited by muscle guarding, improved at end of session   Neck Exercises: Supine   Other Supine Exercise Foam Roll x 3 min, bil overhead flexion, snowangel x 10 each on FR   Other Supine Exercise Cervical retraction x 10 on FR   Modalities   Modalities Electrical Stimulation   Electrical Stimulation   Electrical Stimulation Location cervical   Electrical Stimulation Action IFC   Electrical Stimulation Parameters to pt's tolerance   Electrical Stimulation Goals Pain   Manual Therapy   Manual Therapy Soft tissue mobilization;Passive ROM   Soft tissue mobilization bill cervical paraspinals & UT   Passive ROM into lat flexion, flexion diagonal flex each side                  PT Short Term Goals - 06/19/15 0916    PT SHORT TERM GOAL #1   Title be independent in initial HEP   Time 4   Period Weeks   Status New   PT  SHORT TERM GOAL #2   Title report a 30% reduction in neck pain with looking up/down to allow for return to work   Time 4   Period Weeks   Status New   PT SHORT TERM GOAL #3   Title demonstrate cervical AROM rotation to 50 degrees to improve ability to look behind with driving   Time 4   Period Weeks   Status New   PT SHORT TERM GOAL #4   Title verbalize and demonstrate correct posture    Time 4   Period Weeks   Status New           PT Long Term Goals - 06/19/15 0847    PT LONG TERM GOAL #1   Title be independent in advanced HEP   Time 8   Period Weeks   Status New   PT LONG TERM GOAL #2   Title reduce FOTO to < or = to 33% limitation   Time 8   Period Weeks   Status New   PT LONG TERM GOAL #3   Title demonstrate full cervical AROM in all directions to allow for return to work and for  safety with driving   Time 8   Period Weeks   Status New   PT LONG TERM GOAL #4   Title report a 70% reduction in neck pain with ADLs, driving and work tasks   Time 8   Period Weeks   Status New               Plan - 06/21/15 1012    Clinical Impression Statement Pt with limited AROM cervical. After PT session of stretching, ROM exercises, softtissue work and modalities tremendous increase of ROM cervical. Pt will continue to benefit from skilled PT to address pain in neck and increase ROM , to allow returning to work and normal functional activities   PT Frequency 2x / week   PT Duration 8 weeks   PT Next Visit Plan flexibility, soft tissue work,  manual for trigger point release. postural strength, modalities.  Consider dry needling next week.   Consulted and Agree with Plan of Care Patient      Patient will benefit from skilled therapeutic intervention in order to improve the following deficits and impairments:  Postural dysfunction, Impaired flexibility, Improper body mechanics, Pain, Decreased activity tolerance, Increased muscle spasms, Decreased range of motion  Visit Diagnosis: Cervicalgia  Abnormal posture  Cramp and spasm     Problem List Patient Active Problem List   Diagnosis Date Noted  . Hyperlipidemia 05/24/2008  . KELOID SCAR 05/24/2008  . HIDRADENITIS SUPPURATIVA 05/24/2008  . HOARSENESS 05/24/2008  . ALLERGIC RHINITIS 12/19/2006  . GERD 11/04/2006    NAUMANN-HOUEGNIFIO,Nikky Duba PTA 06/21/2015, 10:39 AM  [facility] [address] [place], [place], [address] Phone: [phone]   Fax:  [phone]  Name: Reginald Simmons MRN: [id number] Date of Birth: Nov 11, 1970

## 2015-06-22 ENCOUNTER — Telehealth: Payer: Self-pay | Admitting: Family Medicine

## 2015-06-22 ENCOUNTER — Encounter: Payer: Self-pay | Admitting: Family Medicine

## 2015-06-22 DIAGNOSIS — S161XXA Strain of muscle, fascia and tendon at neck level, initial encounter: Secondary | ICD-10-CM | POA: Insufficient documentation

## 2015-06-22 NOTE — Telephone Encounter (Signed)
Okay per Dr. Sarajane Jews to write note.

## 2015-06-22 NOTE — Telephone Encounter (Signed)
Patient states he is still in therapy through the end of May.  He needs another work note to put him out of work for more time by tomorrow, 06/23/15.

## 2015-06-22 NOTE — Telephone Encounter (Signed)
Disregard

## 2015-06-23 NOTE — Telephone Encounter (Signed)
Note and paperwork is ready for pick up and I spoke with pt.

## 2015-06-27 ENCOUNTER — Ambulatory Visit: Payer: 59 | Admitting: Physical Therapy

## 2015-06-27 ENCOUNTER — Encounter: Payer: Self-pay | Admitting: Physical Therapy

## 2015-06-27 DIAGNOSIS — R252 Cramp and spasm: Secondary | ICD-10-CM

## 2015-06-27 DIAGNOSIS — M542 Cervicalgia: Secondary | ICD-10-CM | POA: Diagnosis not present

## 2015-06-27 DIAGNOSIS — R293 Abnormal posture: Secondary | ICD-10-CM
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
## 2015-06-27 NOTE — Patient Instructions (Signed)
  Cervico-Thoracic: Extension / Rotation (Sitting)   Reach across body with left arm and grasp back of chair. Gently look over right side shoulder. Hold __20__ seconds. Relax. Repeat ___3_ times per set. Do _1___ sets per session. Do __3__ sessions per day.  Copyright  VHI. All rights reserved.     Lumbar Rotation: Caudal - Bilateral (Supine)   Feet and knees together, arms outstretched, rotate knees left, turning head in opposite direction, until stretch is felt. Hold _20___ seconds. Relax. Repeat __3__ times per set. Do _1___ sets per session. Do _3___ sessions per day.  http://orth.exer.us/1020   Copyright  VHI. All rights reserved.  [facility] [address], [address] [place], [place] [address] Phone # [phone] Fax [phone] 

## 2015-06-29 ENCOUNTER — Ambulatory Visit: Payer: 59

## 2015-06-29 DIAGNOSIS — R293 Abnormal posture: Secondary | ICD-10-CM

## 2015-06-29 DIAGNOSIS — M542 Cervicalgia: Secondary | ICD-10-CM

## 2015-06-29 DIAGNOSIS — R252 Cramp and spasm: Secondary | ICD-10-CM

## 2015-06-29 NOTE — Patient Instructions (Signed)
  PNF Strengthening: Resisted   Standing with resistive band around each hand, bring right arm up and away, thumb back. Repeat _10___ times per set. Do _2___ sets per session. Do _1-2___ sessions per day.      Resisted Horizontal Abduction: Bilateral   Sit or stand, tubing in both hands, arms out in front. Keeping arms straight, pinch shoulder blades together and stretch arms out. Repeat _10___ times per set. Do 2____ sets per session. Do _1-2___ sessions per day.   [facility] [address], [place], [place] [address] Phone # [phone] Fax [phone]

## 2015-06-29 NOTE — Therapy (Signed)
[facility] [address] [place], [place], [address] Phone: [phone]   Fax:  [phone]  Physical Therapy Treatment  Patient Details  Name: NEVAEH HOLLYFIELD MRN: [id number] Date of Birth: 20-Dec-1970 Referring Provider: Alysia Penna, MD  Encounter Date: 06/29/2015      PT End of Session - 06/29/15 0926    Visit Number 4   Date for PT Re-Evaluation 08/14/15   PT Start Time 0845   PT Stop Time 0947   PT Time Calculation (min) 62 min   Activity Tolerance Patient tolerated treatment well   Behavior During Therapy [facility] for tasks assessed/performed      Past Medical History  Diagnosis Date  . Hyperlipidemia   . GERD (gastroesophageal reflux disease)   . Allergy   . Onychomycosis     History reviewed. No pertinent past surgical history.  There were no vitals filed for this visit.      Subjective Assessment - 06/29/15 0851    Subjective Pt reports that he is feeling >90% better since the start of care.  No pain today.   Currently in Pain? No/denies                         [facility] Adult PT Treatment/Exercise - 06/29/15 0001    Neck Exercises: Machines for Strengthening   UBE (Upper Arm Bike) L 2 8 minutes (4/4)   Neck Exercises: Seated   Other Seated Exercise Trunkrotation x 3 with 20sec hold   Neck Exercises: Supine   Other Supine Exercise Foam Roll x 3 min, bil overhead flexion, snowangel x 10 each on FR   Other Supine Exercise trunk rotation 3x20" bil.   Shoulder Exercises: Supine   Horizontal ABduction Strengthening;Both;20 reps;Theraband   Theraband Level (Shoulder Horizontal ABduction) Level 4 (Blue)   Horizontal ABduction Limitations on foam roll   Flexion Strengthening;Both;20 reps;Theraband  D2   Theraband Level (Shoulder Flexion) Level 4 (Blue)   Flexion Limitations on foam roll   Other Supine Exercises Selfmob with towelroll x 2 min    Other Supine Exercises Trunkrotation x 3  with 20 sec hold   Electrical Stimulation   Electrical Stimulation Location cervical   Electrical Stimulation Action IFC   Electrical Stimulation Parameters 15 minutes   Electrical Stimulation Goals Pain                PT Education - 06/29/15 0907    Education provided Yes   Education Details HEP: theraband unattached with blue band   Person(s) Educated Patient   Methods Explanation;Demonstration;Handout   Comprehension Verbalized understanding;Returned demonstration          PT Short Term Goals - 06/27/15 0901    PT SHORT TERM GOAL #1   Title be independent in initial HEP   Time 4   Period Weeks   Status On-going   PT SHORT TERM GOAL #2   Title report a 30% reduction in neck pain with looking up/down to allow for return to work   Time 4   Period Weeks   Status On-going   PT SHORT TERM GOAL #3   Title demonstrate cervical AROM rotation to 50 degrees to improve ability to look behind with driving   Time 4   Period Weeks   Status On-going   PT SHORT TERM GOAL #4   Title verbalize and demonstrate correct posture    Time 4   Period Weeks   Status On-going  PT Long Term Goals - 06/27/15 1015    PT LONG TERM GOAL #1   Title be independent in advanced HEP   Time 8   Period Weeks   Status On-going   PT LONG TERM GOAL #2   Title reduce FOTO to < or = to 33% limitation   Time 8   Period Weeks   Status On-going   PT LONG TERM GOAL #3   Title demonstrate full cervical AROM in all directions to allow for return to work and for safety with driving   Time 8   Period Weeks   Status On-going   PT LONG TERM GOAL #4   Title report a 70% reduction in neck pain with ADLs, driving and work tasks   Time 8   Period Weeks   Status On-going               Plan - 06/29/15 0851    Clinical Impression Statement Pt reports that he feels >90% better since the start of care.  Pt with minimal limitations in cervical AROM now and tension in the neck with  endurance tasks.  Pt tolerated increased time on the arm bike today. Pt will continue with HEP for flexiblity and strength.  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     Rehab Potential Good   PT Frequency 2x / week   PT Duration 8 weeks   PT Treatment/Interventions ADLs/Self Care Home Management;Cryotherapy;Electrical Stimulation;Moist Heat;Therapeutic exercise;Therapeutic activities;Functional mobility training;Ultrasound;Neuromuscular re-education;Patient/family education;Manual techniques;Taping;Dry needling;Passive range of motion   PT Next Visit Plan Dryt needeling, flexibility, soft tissue work,  manual for trigger point release. postural strength, modalities.     Consulted and Agree with Plan of Care Patient      Patient will benefit from skilled therapeutic intervention in order to improve the following deficits and impairments:  Postural dysfunction, Impaired flexibility, Improper body mechanics, Pain, Decreased activity tolerance, Increased muscle spasms, Decreased range of motion  Visit Diagnosis: Cervicalgia  Abnormal posture  Cramp and spasm     Problem List Patient Active Problem List   Diagnosis Date Noted  . Neck strain 06/22/2015  . Hyperlipidemia 05/24/2008  . KELOID SCAR 05/24/2008  . HIDRADENITIS SUPPURATIVA 05/24/2008  . HOARSENESS 05/24/2008  . ALLERGIC RHINITIS 12/19/2006  . GERD 11/04/2006    Sigurd Sos, PT 06/29/2015 9:27 AM  [facility] [address], [place], [place], [address] Phone: [phone]   Fax:  [phone]  Name: KWAMANE RESPICIO MRN: [id number] Date of Birth: 05-30-1970

## 2015-06-30 ENCOUNTER — Ambulatory Visit (INDEPENDENT_AMBULATORY_CARE_PROVIDER_SITE_OTHER): Payer: 59 | Admitting: Family Medicine

## 2015-06-30 ENCOUNTER — Encounter: Payer: Self-pay | Admitting: Family Medicine

## 2015-06-30 VITALS — BP 120/84 | HR 86 | Temp 98.6°F | Ht 72.75 in | Wt 313.0 lb

## 2015-06-30 DIAGNOSIS — S161XXD Strain of muscle, fascia and tendon at neck level, subsequent encounter: Secondary | ICD-10-CM

## 2015-06-30 NOTE — Progress Notes (Signed)
   Subjective:    Patient ID: Reginald Simmons, male    DOB: 02-Feb-1971, 45 y.o.   MRN: [id number]  HPI Here to follow up on a neck injury that occurred on 06-02-15. He has been going to PT and this helped quite a bit. He is making steady progress and he is pleased. He is taking very little pain medication at this point. The ROM of his neck has increased quite a bit. He estimates that he is now at 60-70% of his normal functioning ability. He is still not able to work at all.    Review of Systems  Constitutional: Negative.   Musculoskeletal: Positive for neck pain and neck stiffness.       Objective:   Physical Exam  Constitutional: He appears well-developed and well-nourished.  Musculoskeletal:  The neck is mildly tender. The ROM is reduced but it has improved from his last visit here           Assessment & Plan:  He is slowly but surely recovering from a neck strain. Continue with PT.  Laurey Morale, MD

## 2015-06-30 NOTE — Progress Notes (Signed)
Pre visit review using our clinic review tool, if applicable. No additional management support is needed unless otherwise documented below in the visit note. 

## 2015-07-04 ENCOUNTER — Ambulatory Visit: Payer: 59 | Attending: Family Medicine | Admitting: Physical Therapy

## 2015-07-04 DIAGNOSIS — M542 Cervicalgia: Secondary | ICD-10-CM

## 2015-07-04 DIAGNOSIS — R252 Cramp and spasm: Secondary | ICD-10-CM | POA: Insufficient documentation

## 2015-07-04 DIAGNOSIS — R293 Abnormal posture: Secondary | ICD-10-CM | POA: Diagnosis present

## 2015-07-04 NOTE — Patient Instructions (Signed)
Stacy Simpson PT [facility] [address], [address] [place], [place] [address] Phone # [phone] Fax [phone]    

## 2015-07-04 NOTE — Therapy (Signed)
[facility] [address] [place], [place], [address] Phone: [phone]   Fax:  [phone]  Physical Therapy Treatment  Patient Details  Name: Reginald Simmons MRN: [id number] Date of Birth: 07-17-1970 Referring Provider: Alysia Penna, MD  Encounter Date: 07/04/2015      PT End of Session - 07/04/15 0859    Visit Number 5   Date for PT Re-Evaluation 08/14/15   PT Start Time 0835   PT Stop Time 0940   PT Time Calculation (min) 65 min   Activity Tolerance Patient tolerated treatment well      Past Medical History  Diagnosis Date  . Hyperlipidemia   . GERD (gastroesophageal reflux disease)   . Allergy   . Onychomycosis     No past surgical history on file.  There were no vitals filed for this visit.      Subjective Assessment - 07/04/15 0839    Subjective Still with difficulty turning to the right and left especially with driving, checking blindspot.  Looking up and down is better.  AM stiffness.  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     Currently in Pain? No/denies   Pain Score 4   with turning   Pain Location Neck   Pain Type Acute pain   Pain Frequency Intermittent   Pain Relieving Factors stretches, foam rolls, e-stim/heat;  massaging            [facility] PT Assessment - 07/04/15 0001    AROM   Cervical Flexion 45   Cervical Extension 47   Cervical - Right Side Bend 35   Cervical - Left Side Bend 35   Cervical - Right Rotation 45   Cervical - Left Rotation 55                     [facility] Adult PT Treatment/Exercise - 07/04/15 0001    Neck Exercises: Machines for Strengthening   UBE (Upper Arm Bike) L 2 8 minutes (4/4)   Neck Exercises: Seated   Other Seated Exercise thoracic extension with ball 10x   Other Seated Exercise cervical SNAG with towel 8x R/L   Neck Exercises: Supine   Other Supine Exercise trunk rotation 3x20" bil. with head turn opp way   Shoulder Exercises: Supine   Horizontal ABduction Strengthening;Both;20 reps;Theraband   Theraband Level (Shoulder Horizontal ABduction) Level 4 (Blue)   Horizontal ABduction Limitations on foam roll   External Rotation Strengthening;Theraband;10 reps   Theraband Level (Shoulder External Rotation) Level 4 (Blue)   Flexion Strengthening;Both;20 reps;Theraband  D2   Theraband Level (Shoulder Flexion) Level 4 (Blue)   Flexion Limitations on foam roll   Electrical Stimulation   Electrical Stimulation Location cervical   Electrical Stimulation Action IFC   Electrical Stimulation Parameters 15   Electrical Stimulation Goals Pain   Manual Therapy   Soft tissue mobilization b cervical paraspinals & UT   Passive ROM into lat flexion, flexion diagonal flex each side                  PT Short Term Goals - 07/04/15 2022    PT SHORT TERM GOAL #1   Title be independent in initial HEP   Time 4   Period Weeks   Status On-going   PT SHORT TERM GOAL #2   Title report a 30% reduction in  neck pain with looking up/down to allow for return to work   Time 4   Period Weeks   Status On-going   PT SHORT TERM GOAL #3   Title demonstrate cervical AROM rotation to 50 degrees to improve ability to look behind with driving   Time 4   Period Weeks   Status On-going   PT SHORT TERM GOAL #4   Title verbalize and demonstrate correct posture    Time 4   Period Weeks   Status On-going           PT Long Term Goals - 07/04/15 2023    PT LONG TERM GOAL #1   Title be independent in advanced HEP   Time 8   Period Weeks   Status On-going   PT LONG TERM GOAL #2   Title reduce FOTO to < or = to 33% limitation   Time 8   Period Weeks   Status On-going   PT LONG TERM GOAL #3   Title demonstrate full cervical AROM in all directions to allow for return to work and for safety with driving   Time 8   Period Weeks   PT  LONG TERM GOAL #4   Title report a 70% reduction in neck pain with ADLs, driving and work tasks   Time 8   Period Weeks   Status On-going               Plan - 07/04/15 0926    Clinical Impression Statement The patient reports decreasing pain but continues to have 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  Cervical AROM significantly improved since initial eval but still limited with right > left rotation.  Improved muscle length following treatment session.  Therapist closely monitoring response with all treatment interventions.     PT Next Visit Plan Progressive cervical ROM especially rotation;  progress to standing cervical motions with UE to simulate work tasks;  manual and modalities as needed;  check progress with STGS      Patient will benefit from skilled therapeutic intervention in order to improve the following deficits and impairments:     Visit Diagnosis: Cervicalgia  Abnormal posture  Cramp and spasm     Problem List Patient Active Problem List   Diagnosis Date Noted  . Neck strain 06/22/2015  . Hyperlipidemia 05/24/2008  . KELOID SCAR 05/24/2008  . HIDRADENITIS SUPPURATIVA 05/24/2008  . HOARSENESS 05/24/2008  . ALLERGIC RHINITIS 12/19/2006  . GERD 11/04/2006   Ruben Im, PT 07/04/2015 8:25 PM Phone: [phone] Fax: [phone] [name] 07/04/2015, 8:24 PM  [facility] [address], [place], [place], [address] Phone: [phone]   Fax:  [phone]  Name: Reginald Simmons MRN: [id number] Date of Birth: Dec 08, 1970

## 2015-07-06 ENCOUNTER — Ambulatory Visit: Payer: 59 | Admitting: Physical Therapy

## 2015-07-06 DIAGNOSIS — M542 Cervicalgia: Secondary | ICD-10-CM

## 2015-07-06 DIAGNOSIS — R252 Cramp and spasm: Secondary | ICD-10-CM

## 2015-07-06 DIAGNOSIS — R293 Abnormal posture: Secondary | ICD-10-CM

## 2015-07-06 NOTE — Therapy (Signed)
[facility] [address] [place], [place], [address] Phone: [phone]   Fax:  [phone]  Physical Therapy Treatment  Patient Details  Name: Reginald Simmons MRN: [id number] Date of Birth: 06/10/1970 Referring Provider: Alysia Penna, MD  Encounter Date: 07/06/2015      PT End of Session - 07/06/15 0905    Visit Number 6   Date for PT Re-Evaluation 08/14/15   PT Start Time 0840   PT Stop Time 0929   PT Time Calculation (min) 49 min   Activity Tolerance Patient tolerated treatment well      Past Medical History  Diagnosis Date  . Hyperlipidemia   . GERD (gastroesophageal reflux disease)   . Allergy   . Onychomycosis     No past surgical history on file.  There were no vitals filed for this visit.      Subjective Assessment - 07/06/15 0848    Subjective No pain this morning.  Declines soreness after last visit.     Currently in Pain? No/denies   Pain Score 0-No pain                         [facility] Adult PT Treatment/Exercise - 07/06/15 0001    Neck Exercises: Standing   Wall Push Ups 15 reps   Upper Extremity D1 15 reps;Theraband   Theraband Level (UE D1) Level 3 (Green)  single arm to each side   Other Standing Exercises B UE Wall slides 10x   Other Standing Exercises snow angels 10x against wall   Neck Exercises: Prone   Axial Exentsion 5 reps  x3 with B shoulder extension, HABD; one arm superman   Shoulder Exercises: ROM/Strengthening   UBE (Upper Arm Bike) 8 min Forward/backward seated   Shoulder Exercises: Power [employer] 15 reps   Row Limitations x2 25#   Other Power Emergency planning/management officer seated 25# 2x15   Land IFC   Electrical Stimulation Parameters 15   Electrical Stimulation Goals Pain                  PT Short Term Goals - 07/06/15 0941    PT SHORT TERM GOAL  #1   Title be independent in initial HEP   Status Achieved   PT SHORT TERM GOAL #2   Title report a 30% reduction in neck pain with looking up/down to allow for return to work   Time 4   Period Weeks   Status On-going   PT SHORT TERM GOAL #3   Title demonstrate cervical AROM rotation to 50 degrees to improve ability to look behind with driving   Time 4   Period Weeks   Status On-going   PT SHORT TERM GOAL #4   Title verbalize and demonstrate correct posture    Time 4   Period Weeks   Status On-going           PT Long Term Goals - 07/06/15 [id number]    PT LONG TERM GOAL #1   Title be independent in advanced HEP   Time 8   Period Weeks   Status On-going   PT LONG TERM GOAL #2   Title reduce FOTO to < or = to 33% limitation   Time 8   Period Weeks   Status On-going   PT LONG TERM GOAL #3   Title  demonstrate full cervical AROM in all directions to allow for return to work and for safety with driving   Time 8   Period Weeks   Status On-going   PT LONG TERM GOAL #4   Title report a 70% reduction in neck pain with ADLs, driving and work tasks   Time [place] - 07/06/15 0905    Clinical Impression Statement The patient is able to progress to a moderate level of  therapeutic exercise for cervical/scapular and postural strengthening and work simulation Chief Strategy Officer) with minimal exacerbation of pain.  Minimal verbal cues for postural alignment.  Therapist closely monitoring response throughout session.   PT Next Visit Plan Progressive cervical ROM especially rotation;  progress to standing cervical motions with UE to simulate work tasks;  manual and modalities as needed;  check progress with STGS      Patient will benefit from skilled therapeutic intervention in order to improve the following deficits and impairments:     Visit Diagnosis: Cervicalgia  Abnormal posture  Cramp and spasm     Problem List Patient Active  Problem List   Diagnosis Date Noted  . Neck strain 06/22/2015  . Hyperlipidemia 05/24/2008  . KELOID SCAR 05/24/2008  . HIDRADENITIS SUPPURATIVA 05/24/2008  . HOARSENESS 05/24/2008  . ALLERGIC RHINITIS 12/19/2006  . GERD 11/04/2006    Ruben Im, PT 07/06/2015 9:46 AM Phone: [phone] Fax: [phone]  [name] 07/06/2015, 9:46 AM  [facility] [address] [place], [place], [address] Phone: [phone]   Fax:  [phone]  Name: Reginald Simmons MRN: [id number] Date of Birth: 1970-12-25

## 2015-07-11 ENCOUNTER — Ambulatory Visit: Payer: 59 | Admitting: Physical Therapy

## 2015-07-11 ENCOUNTER — Encounter: Payer: Self-pay | Admitting: Physical Therapy

## 2015-07-11 DIAGNOSIS — R293 Abnormal posture: Secondary | ICD-10-CM

## 2015-07-11 DIAGNOSIS — M542 Cervicalgia: Secondary | ICD-10-CM

## 2015-07-11 DIAGNOSIS — R252 Cramp and spasm: Secondary | ICD-10-CM

## 2015-07-11 NOTE — Therapy (Signed)
[facility] [address], [place], [place], [address] Phone: [phone]   Fax:  [phone]  Physical Therapy Treatment  Patient Details  Name: Reginald Simmons MRN: [id number] Date of Birth: 11/24/1970 Referring Provider: Alysia Penna, MD  Encounter Date: 07/11/2015      PT End of Session - 07/11/15 0926    Visit Number 7   Date for PT Re-Evaluation 08/14/15   PT Start Time 0844   PT Stop Time 0945   PT Time Calculation (min) 61 min   Activity Tolerance Patient tolerated treatment well   Behavior During Therapy [facility] for tasks assessed/performed      Past Medical History  Diagnosis Date  . Hyperlipidemia   . GERD (gastroesophageal reflux disease)   . Allergy   . Onychomycosis     No past surgical history on file.  There were no vitals filed for this visit.      Subjective Assessment - 07/11/15 0858    Subjective No pain this morning. Pt reports notices improved ROM cervical in all daily actitivites esspecially with driving the car and turning head to check for blindspot   Pertinent History Neck pain began after riding roller coaster 06/02/2015. 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 pain with computer work.  Not limited to time, just aggravates symptoms   Diagnostic tests none   Patient Stated Goals reduce neck pain, not able to exercise, return to work, turn head with driving   Currently in Pain? No/denies                         [facility] Adult PT Treatment/Exercise - 07/11/15 0001    Posture/Postural Control   Posture/Postural Control No significant limitations   Exercises   Exercises Neck;Shoulder   Neck Exercises: Machines for Strengthening   UBE (Upper Arm Bike) sitting on Blue ball L 2 8 minutes (4/4)   Neck Exercises: Standing   Wall Push Ups --   Upper Extremity D1 15 reps;Theraband  each side on FR   Theraband Level (UE  D1) Level 3 (Green)   Other Standing Exercises snow angels 10x on foam rolll   Neck Exercises: Seated   Other Seated Exercise trunkrotation x 3 with 20sec hold each side in chair   Other Seated Exercise cervical SNAG with towel 10x R/L   Neck Exercises: Supine   Other Supine Exercise Foam Roll x 3 min, bil overhead flexion, snowangel x 10 each on FR   Other Supine Exercise trunk rotation 3x20" bil. with head turn opp way   Shoulder Exercises: Power [employer] 10 reps  30# x 2    Row Limitations x2 25#   Modalities   Modalities Electrical Stimulation;Moist Heat   Moist Heat Therapy   Number Minutes Moist Heat 15 Minutes   Moist Heat Location Cervical   Electrical Stimulation   Electrical Stimulation Location cervical   Electrical Stimulation Action IFC   Electrical Stimulation Parameters 15   Electrical Stimulation Goals Pain   Manual Therapy   Manual Therapy Soft tissue mobilization;Passive ROM   Soft tissue mobilization b cervical paraspinals & UT   Passive ROM into lat flexion, flexion diagonal flex each side                  PT Short Term Goals - 07/11/15 0928    PT SHORT TERM GOAL #  1   Title be independent in initial HEP   Time 4   Period Weeks   Status Achieved   PT SHORT TERM GOAL #2   Title report a 30% reduction in neck pain with looking up/down to allow for return to work   Time 4   Period Weeks   Status Achieved   PT SHORT TERM GOAL #3   Title demonstrate cervical AROM rotation to 50 degrees to improve ability to look behind with driving   Time 4   Period Weeks   Status On-going   PT SHORT TERM GOAL #4   Title verbalize and demonstrate correct posture    Time 4   Period Weeks   Status Achieved           PT Long Term Goals - 07/06/15 [id number]    PT LONG TERM GOAL #1   Title be independent in advanced HEP   Time 8   Period Weeks   Status On-going   PT LONG TERM GOAL #2   Title reduce FOTO to < or = to 33% limitation   Time 8   Period  Weeks   Status On-going   PT LONG TERM GOAL #3   Title demonstrate full cervical AROM in all directions to allow for return to work and for safety with driving   Time 8   Period Weeks   Status On-going   PT LONG TERM GOAL #4   Title report a 70% reduction in neck pain with ADLs, driving and work tasks   Time 8   Period Weeks   Status On-going               Plan - 07/11/15 0926    Clinical Impression Statement Patient with improved cervical ROM and able to tolerate strengtheing exercises pt will continue to benefit from skilled PT to advance toward functional activities (welder).   Rehab Potential Good   PT Frequency 2x / week   PT Duration 8 weeks   PT Treatment/Interventions ADLs/Self Care Home Management;Cryotherapy;Electrical Stimulation;Moist Heat;Therapeutic exercise;Therapeutic activities;Functional mobility training;Ultrasound;Neuromuscular re-education;Patient/family education;Manual techniques;Taping;Dry needling;Passive range of motion   PT Next Visit Plan Progressive cervical ROM especially rotation;  progress to standing cervical motions with UE to simulate work tasks;  manual and modalities as needed;  check progress with STGS   Consulted and Agree with Plan of Care Patient      Patient will benefit from skilled therapeutic intervention in order to improve the following deficits and impairments:  Postural dysfunction, Impaired flexibility, Improper body mechanics, Pain, Decreased activity tolerance, Increased muscle spasms, Decreased range of motion  Visit Diagnosis: Cervicalgia  Abnormal posture  Cramp and spasm     Problem List Patient Active Problem List   Diagnosis Date Noted  . Neck strain 06/22/2015  . Hyperlipidemia 05/24/2008  . KELOID SCAR 05/24/2008  . HIDRADENITIS SUPPURATIVA 05/24/2008  . HOARSENESS 05/24/2008  . ALLERGIC RHINITIS 12/19/2006  . GERD 11/04/2006    NAUMANN-HOUEGNIFIO,Hewitt Garner PTA 07/11/2015, 5:33 PM  [facility] [address] [place], [place], [address] Phone: [phone]   Fax:  [phone]  Name: Reginald GAYLE MRN: [id number] Date of Birth: May 05, 1970

## 2015-07-12 DIAGNOSIS — Z7689 Persons encountering health services in other specified circumstances: Secondary | ICD-10-CM

## 2015-07-13 ENCOUNTER — Ambulatory Visit: Payer: 59

## 2015-07-13 DIAGNOSIS — M542 Cervicalgia: Secondary | ICD-10-CM | POA: Diagnosis not present

## 2015-07-13 DIAGNOSIS — R252 Cramp and spasm: Secondary | ICD-10-CM

## 2015-07-13 DIAGNOSIS — R293 Abnormal posture: Secondary | ICD-10-CM

## 2015-07-13 NOTE — Patient Instructions (Signed)

## 2015-07-13 NOTE — Therapy (Signed)
[facility] [address] [place], [place], [address] Phone: [phone]   Fax:  [phone]  Physical Therapy Treatment  Patient Details  Name: Reginald Simmons MRN: [id number] Date of Birth: 1970/10/28 Referring Provider: Alysia Penna, MD  Encounter Date: 07/13/2015      PT End of Session - 07/13/15 0929    Visit Number 8   Date for PT Re-Evaluation 08/14/15   PT Start Time 0844   PT Stop Time 0945   PT Time Calculation (min) 61 min   Activity Tolerance Patient tolerated treatment well   Behavior During Therapy [facility] for tasks assessed/performed      Past Medical History  Diagnosis Date  . Hyperlipidemia   . GERD (gastroesophageal reflux disease)   . Allergy   . Onychomycosis     History reviewed. No pertinent past surgical history.  There were no vitals filed for this visit.      Subjective Assessment - 07/13/15 0851    Subjective Pt reports that he is continuing to feel better.  Only notices discomfort with turning head to the Rt now.     Patient Stated Goals reduce neck pain, not able to exercise, return to work, turn head with driving   Currently in Pain? No/denies                         [facility] Adult PT Treatment/Exercise - 07/13/15 0001    Neck Exercises: Machines for Strengthening   UBE (Upper Arm Bike) sitting on Blue ball L 2 8 minutes (4/4)   Neck Exercises: Seated   Other Seated Exercise trunkrotation x 3 with 20sec hold each side in chair   Shoulder Exercises: Power [employer] 25 reps  30#    Row Limitations 30# 3x10   Moist Heat Therapy   Number Minutes Moist Heat 15 Minutes   Moist Heat Location Cervical   Electrical Stimulation   Electrical Stimulation Location cervical   Electrical Stimulation Action IFC   Electrical Stimulation Parameters 15   Electrical Stimulation Goals Pain   Manual Therapy   Manual Therapy Soft tissue mobilization;Passive ROM   Soft tissue  mobilization b cervical paraspinals & UT   Passive ROM into lat flexion, flexion diagonal flex each side          Trigger Point Dry Needling - 07/13/15 0903    Consent Given? Yes   Education Handout Provided Yes   Muscles Treated Upper Body Upper trapezius;Oblique capitus   Upper Trapezius Response Twitch reponse elicited;Palpable increased muscle length   Oblique Capitus Response Twitch response elicited;Palpable increased muscle length   SubOccipitals Response --  cervical multifidi on Rt              PT Education - 07/13/15 0900    Education provided Yes   Education Details DN info   Person(s) Educated Patient   Methods Explanation;Demonstration;Handout   Comprehension Verbalized understanding;Returned demonstration          PT Short Term Goals - 07/11/15 0928    PT SHORT TERM GOAL #1   Title be independent in initial HEP   Time 4   Period Weeks   Status Achieved   PT SHORT TERM GOAL #2   Title report a 30% reduction in neck pain with looking up/down to allow for return to work   Time 4   Period Weeks   Status Achieved   PT SHORT TERM GOAL #3  Title demonstrate cervical AROM rotation to 50 degrees to improve ability to look behind with driving   Time 4   Period Weeks   Status On-going   PT SHORT TERM GOAL #4   Title verbalize and demonstrate correct posture    Time 4   Period Weeks   Status Achieved           PT Long Term Goals - 07/06/15 [id number]    PT LONG TERM GOAL #1   Title be independent in advanced HEP   Time 8   Period Weeks   Status On-going   PT LONG TERM GOAL #2   Title reduce FOTO to < or = to 33% limitation   Time 8   Period Weeks   Status On-going   PT LONG TERM GOAL #3   Title demonstrate full cervical AROM in all directions to allow for return to work and for safety with driving   Time 8   Period Weeks   Status On-going   PT LONG TERM GOAL #4   Title report a 70% reduction in neck pain with ADLs, driving and work tasks    Time 8   Period Weeks   Status On-going               Plan - 07/13/15 [id number]    Clinical Impression Statement Pt reports 70% overall improvement in symptoms since the start of care.  Pt with continued Lt neck pain and trigger points in cervical musculature.  Pt tolerated increased weight today with rowing.  Pt with improved tissue length and mobility after DN today.  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     Rehab Potential Good   PT Frequency 2x / week   PT Duration 8 weeks   PT Treatment/Interventions ADLs/Self Care Home Management;Cryotherapy;Electrical Stimulation;Moist Heat;Therapeutic exercise;Therapeutic activities;Functional mobility training;Ultrasound;Neuromuscular re-education;Patient/family education;Manual techniques;Taping;Dry needling;Passive range of motion   PT Next Visit Plan Progressive cervical ROM especially rotation;  progress to standing cervical motions with UE to simulate work tasks;  manual and modalities as needed;  See how pt responded to DN   Consulted and Agree with Plan of Care Patient      Patient will benefit from skilled therapeutic intervention in order to improve the following deficits and impairments:  Postural dysfunction, Impaired flexibility, Improper body mechanics, Pain, Decreased activity tolerance, Increased muscle spasms, Decreased range of motion  Visit Diagnosis: Cervicalgia  Abnormal posture  Cramp and spasm     Problem List Patient Active Problem List   Diagnosis Date Noted  . Neck strain 06/22/2015  . Hyperlipidemia 05/24/2008  . KELOID SCAR 05/24/2008  . HIDRADENITIS SUPPURATIVA 05/24/2008  . HOARSENESS 05/24/2008  . ALLERGIC RHINITIS 12/19/2006  . GERD 11/04/2006    Reginald Simmons, PT 07/13/2015 9:31 AM  [facility] [address], [place], [place],  [address] Phone: [phone]   Fax:  [phone]  Name: Reginald Simmons MRN: [id number] Date of Birth: December 22, 1970

## 2015-07-18 ENCOUNTER — Encounter: Payer: Self-pay | Admitting: Physical Therapy

## 2015-07-18 ENCOUNTER — Ambulatory Visit: Payer: 59 | Admitting: Physical Therapy

## 2015-07-18 DIAGNOSIS — M542 Cervicalgia: Secondary | ICD-10-CM

## 2015-07-18 DIAGNOSIS — R293 Abnormal posture: Secondary | ICD-10-CM

## 2015-07-18 DIAGNOSIS — R252 Cramp and spasm: Secondary | ICD-10-CM

## 2015-07-18 NOTE — Therapy (Signed)
[facility] [address] [place], [place], [address] Phone: [phone]   Fax:  [phone]  Physical Therapy Treatment  Patient Details  Name: RENN BUCHOLZ MRN: [id number] Date of Birth: Mar 14, 1970 Referring Provider: Alysia Penna, MD  Encounter Date: 07/18/2015      PT End of Session - 07/18/15 0903    Visit Number 9   Date for PT Re-Evaluation 08/14/15   PT Start Time 0846   PT Stop Time 0947   PT Time Calculation (min) 61 min   Activity Tolerance Patient tolerated treatment well   Behavior During Therapy [facility] for tasks assessed/performed      Past Medical History  Diagnosis Date  . Hyperlipidemia   . GERD (gastroesophageal reflux disease)   . Allergy   . Onychomycosis     History reviewed. No pertinent past surgical history.  There were no vitals filed for this visit.      Subjective Assessment - 07/18/15 0858    Subjective Pt reports his cervical ROM improved, but still feels stiffness at available range L>R.    Pertinent History Neck pain began after riding roller coaster 06/02/2015. 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 pain with computer work.  Not limited to time, just aggravates symptoms   Diagnostic tests none   Patient Stated Goals reduce neck pain, not able to exercise, return to work, turn head with driving   Currently in Pain? No/denies                         [facility] Adult PT Treatment/Exercise - 07/18/15 0001    Posture/Postural Control   Posture/Postural Control No significant limitations   Exercises   Exercises Neck;Shoulder   Neck Exercises: Machines for Strengthening   UBE (Upper Arm Bike) sitting on Blue ball L 2 8 minutes (4/4) with turning head Lt/Rt   Neck Exercises: Seated   Other Seated Exercise trunkrotation x 3 with 20sec hold each side in chair   Shoulder Exercises: Power Tower   Row 20 reps;10  reps  35# 3  x10   Row Limitations 35# 3 x10   Other Power [employer] Exercises Pectoralis push 40# horizontal/vertical grip   2x10   Modalities   Modalities Electrical Stimulation;Moist Heat   Moist Heat Therapy   Number Minutes Moist Heat 15 Minutes   Moist Heat Location Cervical   Electrical Stimulation   Electrical Stimulation Location cervical   Electrical Stimulation Action IFC   Electrical Stimulation Parameters 15   Electrical Stimulation Goals Pain   Manual Therapy   Manual Therapy Soft tissue mobilization;Passive ROM   Soft tissue mobilization b cervical paraspinals & UT   Passive ROM into lat flexion, flexion diagonal flex each side                  PT Short Term Goals - 07/18/15 0915    PT SHORT TERM GOAL #1   Title be independent in initial HEP   Time 4   Period Weeks   Status Achieved   PT SHORT TERM GOAL #2   Title report a 30% reduction in neck pain with looking up/down to allow for return to work   Time 4   Period Weeks   Status Achieved   PT SHORT TERM GOAL #3   Title demonstrate cervical AROM rotation to 50 degrees to improve ability to look behind  with driving  Rt [id number], Lt 60degrees   Time 4   Period Weeks   Status Achieved   PT SHORT TERM GOAL #4   Title verbalize and demonstrate correct posture    Time 4   Period Weeks   Status Achieved           PT Long Term Goals - 07/06/15 [id number]    PT LONG TERM GOAL #1   Title be independent in advanced HEP   Time 8   Period Weeks   Status On-going   PT LONG TERM GOAL #2   Title reduce FOTO to < or = to 33% limitation   Time 8   Period Weeks   Status On-going   PT LONG TERM GOAL #3   Title demonstrate full cervical AROM in all directions to allow for return to work and for safety with driving   Time 8   Period Weeks   Status On-going   PT LONG TERM GOAL #4   Title report a 70% reduction in neck pain with ADLs, driving and work tasks   Time 8   Period Weeks   Status On-going                Plan - 07/18/15 0903    Clinical Impression Statement Pt continues to improve with ROM and strength. He continues to present with trigger points in cervical musculatur. Pt tolerated incr. of weight with rowing. Pt will benefit to improve with PT to improve ROM, reduce trigger points  and dincrease strength.   Rehab Potential Good   PT Frequency 2x / week   PT Duration 8 weeks   PT Treatment/Interventions ADLs/Self Care Home Management;Cryotherapy;Electrical Stimulation;Moist Heat;Therapeutic exercise;Therapeutic activities;Functional mobility training;Ultrasound;Neuromuscular re-education;Patient/family education;Manual techniques;Taping;Dry needling;Passive range of motion   PT Next Visit Plan Progressive cervical ROM especially rotation;  progress to standing cervical motions with UE to simulate work tasks;  manual and modalities as needed;  See how pt responded to DN   Consulted and Agree with Plan of Care Patient      Patient will benefit from skilled therapeutic intervention in order to improve the following deficits and impairments:  Postural dysfunction, Impaired flexibility, Improper body mechanics, Pain, Decreased activity tolerance, Increased muscle spasms, Decreased range of motion  Visit Diagnosis: Cervicalgia  Abnormal posture  Cramp and spasm     Problem List Patient Active Problem List   Diagnosis Date Noted  . Neck strain 06/22/2015  . Hyperlipidemia 05/24/2008  . KELOID SCAR 05/24/2008  . HIDRADENITIS SUPPURATIVA 05/24/2008  . HOARSENESS 05/24/2008  . ALLERGIC RHINITIS 12/19/2006  . GERD 11/04/2006    NAUMANN-HOUEGNIFIO,Nehan Flaum PTA 07/18/2015, 9:51 AM  [facility] [address], [place], [place], [address] Phone: [phone]   Fax:  [phone]  Name: YAKUB BERENDSEN MRN: [id number] Date of Birth: 1970-06-08

## 2015-07-20 ENCOUNTER — Ambulatory Visit: Payer: 59 | Admitting: Physical Therapy

## 2015-07-20 ENCOUNTER — Telehealth: Payer: Self-pay | Admitting: Physical Therapy

## 2015-07-20 NOTE — Telephone Encounter (Signed)
PTA called patient since he missed his 8:45 appointment on Thursday May 18th, but unable to leave message on voice mail. Reginald Simmons PTA

## 2015-07-25 ENCOUNTER — Ambulatory Visit: Payer: 59 | Admitting: Physical Therapy

## 2015-07-25 ENCOUNTER — Telehealth: Payer: Self-pay | Admitting: Family Medicine

## 2015-07-25 DIAGNOSIS — R293 Abnormal posture: Secondary | ICD-10-CM

## 2015-07-25 DIAGNOSIS — M542 Cervicalgia: Secondary | ICD-10-CM | POA: Diagnosis not present

## 2015-07-25 DIAGNOSIS — R252 Cramp and spasm: Secondary | ICD-10-CM

## 2015-07-25 NOTE — Telephone Encounter (Signed)
Per pt request fax note to [employer] [phone].

## 2015-07-25 NOTE — Therapy (Signed)
[facility] [address] [place], [place], [address] Phone: [phone]   Fax:  [phone]  Physical Therapy Treatment  Patient Details  Name: Reginald Simmons MRN: [id number] Date of Birth: [birth year] Referring Provider: Alysia Penna, MD  Encounter Date: 07/25/2015      PT End of Session - 07/25/15 0910    Visit Number 10   Date for PT Re-Evaluation 08/14/15   PT Start Time 0846   PT Stop Time 0945   PT Time Calculation (min) 59 min   Activity Tolerance Patient tolerated treatment well      Past Medical History  Diagnosis Date  . Hyperlipidemia   . GERD (gastroesophageal reflux disease)   . Allergy   . Onychomycosis     No past surgical history on file.  There were no vitals filed for this visit.      Subjective Assessment - 07/25/15 0848    Subjective Reports improvement with rotation AROM following dry needling.  Stiffness with left rotation at endrange.   No pain at rest.   No pain sitting.     Currently in Pain? Yes   Pain Score 0-No pain   Pain Type Acute pain            [facility] PT Assessment - 07/25/15 0001    Observation/Other Assessments   Focus on Therapeutic Outcomes (FOTO)  41% limitation    AROM   Cervical Flexion 46   Cervical Extension 40   Cervical - Right Side Bend 35   Cervical - Left Side Bend 35   Cervical - Right Rotation 45   Cervical - Left Rotation 50                     [facility] Adult PT Treatment/Exercise - 07/25/15 0001    Neck Exercises: Machines for Strengthening   UBE (Upper Arm Bike) L 2 8 minutes (4/4)   Neck Exercises: Standing   Other Standing Exercises shoulder Horizontal adduction with cervical rotation 3x  20 sec holds   Shoulder Exercises: Standing   Other Standing Exercises Body Blade 3 positions R/L 30 sec x2   Moist Heat Therapy   Number Minutes Moist Heat 15 Minutes   Moist Heat Location Cervical   Electrical Stimulation   Electrical  Stimulation Location cervical   Electrical Stimulation Action IFC   Electrical Stimulation Parameters 15   Electrical Stimulation Goals Pain   Manual Therapy   Manual Therapy Manual Traction;Muscle Energy Technique   Soft tissue mobilization b cervical paraspinals & UT   Manual Traction 3x 20 sec   Muscle Energy Technique Bilateral contract/relax 3x 5 sec          Trigger Point Dry Needling - 07/25/15 0908    Consent Given? Yes   Muscles Treated Upper Body --  cervical multifidi B   Upper Trapezius Response Twitch reponse elicited;Palpable increased muscle length                PT Short Term Goals - 07/25/15 0912    PT SHORT TERM GOAL #1   Title be independent in initial HEP   Status Achieved   PT SHORT TERM GOAL #2   Title report a 30% reduction in neck pain with looking up/down to allow for return to work   Status Achieved   PT SHORT TERM GOAL #3   Title demonstrate cervical AROM rotation to 50 degrees to improve ability to look behind with driving  Status Achieved   PT SHORT TERM GOAL #4   Title verbalize and demonstrate correct posture    Status Achieved           PT Long Term Goals - 07/25/15 0912    PT LONG TERM GOAL #1   Title be independent in advanced HEP   Time 8   Period Weeks   Status On-going   PT LONG TERM GOAL #2   Title reduce FOTO to < or = to 33% limitation   Time 8   Period Weeks   Status On-going   PT LONG TERM GOAL #3   Title demonstrate full cervical AROM in all directions to allow for return to work and for safety with driving   Time 8   Period Weeks   Status On-going   PT LONG TERM GOAL #4   Title report a 70% reduction in neck pain with ADLs, driving and work tasks   Time 8   Period Weeks   Status On-going               Plan - 07/25/15 0910    Clinical Impression Statement Good improvement in FOTO functional outcome score from 57% limitation to 41% limitation.   Minimal change in cervical AROM in last 2 weeks.   Asymmetry in right and left rotation ROM.  Improved soft tissue muscle length following treatment session.  Should meet remaining goals in next 4-5 visits.     PT Next Visit Plan assess response to dry needle #2;  continue with manual therapy to encourage cervical rotation;  e-stim/heat as needed for pain and to increase ROM;  UE functional strengthening to prepare for return to work      Patient will benefit from skilled therapeutic intervention in order to improve the following deficits and impairments:     Visit Diagnosis: Cervicalgia  Abnormal posture  Cramp and spasm     Problem List Patient Active Problem List   Diagnosis Date Noted  . Neck strain 06/22/2015  . Hyperlipidemia 05/24/2008  . KELOID SCAR 05/24/2008  . HIDRADENITIS SUPPURATIVA 05/24/2008  . HOARSENESS 05/24/2008  . ALLERGIC RHINITIS 12/19/2006  . GERD 11/04/2006     Ruben Im, PT 07/25/2015 10:40 AM Phone: [phone] Fax: [phone]  Reginald Simmons 07/25/2015, 10:39 AM  [facility] [address] [place], [place], [address] Phone: [phone]   Fax:  [phone]  Name: PAO GHAN MRN: [id number] Date of Birth: 1970/06/15

## 2015-07-25 NOTE — Telephone Encounter (Signed)
Patient went to outpatient rehab today and they had to extend his rehab to August 10, 2015. He was wondering if he needs to see you to update his disability since it ends Friday 07/28/2015. He said that he's going to let them know that they extended his rehab. He just wanted to keep everyone in the loop.

## 2015-07-25 NOTE — Telephone Encounter (Signed)
Note was written for 06/05/2015 through 08/14/2015 returning to work on 08/15/2015 and faxed to below number, pt will also pick up a copy of this.

## 2015-07-25 NOTE — Telephone Encounter (Signed)
Extend his work leave until 08-14-15

## 2015-07-27 ENCOUNTER — Encounter: Payer: Self-pay | Admitting: Physical Therapy

## 2015-07-27 ENCOUNTER — Ambulatory Visit: Payer: 59 | Admitting: Physical Therapy

## 2015-07-27 DIAGNOSIS — M542 Cervicalgia: Secondary | ICD-10-CM

## 2015-07-27 DIAGNOSIS — R293 Abnormal posture: Secondary | ICD-10-CM

## 2015-07-27 DIAGNOSIS — R252 Cramp and spasm: Secondary | ICD-10-CM

## 2015-07-27 NOTE — Therapy (Signed)
[facility] [address] [place], [place], [address] Phone: [phone]   Fax:  [phone]  Physical Therapy Treatment  Patient Details  Name: Reginald Simmons MRN: [id number] Date of Birth: 22-Nov-1970 Referring Provider: Alysia Penna, MD  Encounter Date: 07/27/2015      PT End of Session - 07/27/15 0901    Visit Number 11   Date for PT Re-Evaluation 08/14/15   PT Start Time 0843   PT Stop Time 0946   PT Time Calculation (min) 63 min   Activity Tolerance Patient tolerated treatment well   Behavior During Therapy [facility] for tasks assessed/performed      Past Medical History  Diagnosis Date  . Hyperlipidemia   . GERD (gastroesophageal reflux disease)   . Allergy   . Onychomycosis     History reviewed. No pertinent past surgical history.  There were no vitals filed for this visit.      Subjective Assessment - 07/27/15 0852    Subjective Dry needeling last session helped, however rotation cervical rotation to left side is still limited and feeling of stiffness and soreness     Pertinent History Neck pain began after riding roller coaster 06/02/2015. 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 pain with computer work.  Not limited to time, just aggravates symptoms   Diagnostic tests none   Patient Stated Goals reduce neck pain, not able to exercise, return to work, turn head with driving   Currently in Pain? Yes  with turning to left   Pain Score 5   no pain in neutral position   Pain Location Neck   Pain Orientation Right   Pain Descriptors / Indicators Sore   Pain Type Acute pain   Pain Onset More than a month ago   Pain Frequency Intermittent   Aggravating Factors  sitting in one position, turnbing head with driving, stiffenes up during the night   Pain Relieving Factors stretches, foam roll, e-stim/heat, softtissue work    Multiple Pain Sites No                          [facility] Adult PT Treatment/Exercise - 07/27/15 0001    Posture/Postural Control   Posture/Postural Control No significant limitations   Exercises   Exercises Neck;Shoulder   Neck Exercises: Machines for Strengthening   UBE (Upper Arm Bike) L 2 8 minutes (4/4)   Neck Exercises: Standing   Other Standing Exercises shoulder Horizontal adduction with cervical rotation 3x  20 sec holds   Other Standing Exercises FR x 3 min, cervical rotation x 5 with 10" each side   Neck Exercises: Seated   Other Seated Exercise trunk extension /ball in back x 10   Shoulder Exercises: Standing   Other Standing Exercises Body Blade 3 positions Rt/Lt 30 sec x2   Shoulder Exercises: Power Tower   Row 20 reps;10 reps   Row Limitations 35# 3 x10   Other Power Tower Exercises Pectoralis push 50# horizontal/vertical grip   2x10  pt tolerated incr in weight well   Modalities   Modalities Electrical Stimulation;Moist Heat   Moist Heat Therapy   Number Minutes Moist Heat 15 Minutes   Moist Heat Location Cervical   Electrical Stimulation   Electrical Stimulation Location cervical   Electrical Stimulation Action IFC   Electrical Stimulation Parameters 15   Electrical Stimulation Goals Pain  Manual Therapy   Manual Therapy Soft tissue mobilization;Passive ROM   Soft tissue mobilization b cervical paraspinals & UT   Passive ROM into lat flexion, flexion diagonal flex each side                  PT Short Term Goals - 07/25/15 0912    PT SHORT TERM GOAL #1   Title be independent in initial HEP   Status Achieved   PT SHORT TERM GOAL #2   Title report a 30% reduction in neck pain with looking up/down to allow for return to work   Status Achieved   PT SHORT TERM GOAL #3   Title demonstrate cervical AROM rotation to 50 degrees to improve ability to look behind with driving   Status Achieved   PT SHORT TERM GOAL #4   Title verbalize and demonstrate correct posture     Status Achieved           PT Long Term Goals - 07/25/15 0912    PT LONG TERM GOAL #1   Title be independent in advanced HEP   Time 8   Period Weeks   Status On-going   PT LONG TERM GOAL #2   Title reduce FOTO to < or = to 33% limitation   Time 8   Period Weeks   Status On-going   PT LONG TERM GOAL #3   Title demonstrate full cervical AROM in all directions to allow for return to work and for safety with driving   Time 8   Period Weeks   Status On-going   PT LONG TERM GOAL #4   Title report a 70% reduction in neck pain with ADLs, driving and work tasks   Time 8   Period Weeks   Status On-going               Plan - 07/27/15 0902    Clinical Impression Statement Pt with decreased cervical AROM to left and stiffness and sorness with this motion. 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    Rehab Potential Good   PT Duration 8 weeks   PT Treatment/Interventions ADLs/Self Care Home Management;Cryotherapy;Electrical Stimulation;Moist Heat;Therapeutic exercise;Therapeutic activities;Functional mobility training;Ultrasound;Neuromuscular re-education;Patient/family education;Manual techniques;Taping;Dry needling;Passive range of motion   PT Next Visit Plan pt with good response to dry needle #2;  continue with manual therapy to encourage cervical rotation;  e-stim/heat as needed for pain and to increase ROM;  UE functional strengthening to prepare for return to work   [employer] and Agree with Plan of Care Patient      Patient will benefit from skilled therapeutic intervention in order to improve the following deficits and impairments:  Postural dysfunction, Impaired flexibility, Improper body mechanics, Pain, Decreased activity tolerance, Increased muscle spasms, Decreased range of motion  Visit Diagnosis: Cervicalgia  Abnormal posture  Cramp and spasm     Problem List Patient Active  Problem List   Diagnosis Date Noted  . Neck strain 06/22/2015  . Hyperlipidemia 05/24/2008  . KELOID SCAR 05/24/2008  . HIDRADENITIS SUPPURATIVA 05/24/2008  . HOARSENESS 05/24/2008  . ALLERGIC RHINITIS 12/19/2006  . GERD 11/04/2006    NAUMANN-HOUEGNIFIO,Mihaela Fajardo PTA 07/27/2015, 12:14 PM  [facility] [address], [place], [place], [address] Phone: [phone]   Fax:  [phone]  Name: TREVELLE ROSENFIELD MRN: [id number] Date of Birth: [birth year]

## 2015-08-01 ENCOUNTER — Ambulatory Visit: Payer: 59 | Admitting: Physical Therapy

## 2015-08-01 DIAGNOSIS — R293 Abnormal posture: Secondary | ICD-10-CM

## 2015-08-01 DIAGNOSIS — R252 Cramp and spasm: Secondary | ICD-10-CM

## 2015-08-01 DIAGNOSIS — M542 Cervicalgia: Secondary | ICD-10-CM

## 2015-08-01 NOTE — Therapy (Signed)
[facility] [address], [place], [place], [address] Phone: [phone]   Fax:  [phone]  Physical Therapy Treatment  Patient Details  Name: Reginald Simmons MRN: [id number] Date of Birth: [birth year] Referring Provider: Alysia Penna, MD  Encounter Date: 08/01/2015      PT End of Session - 08/01/15 0944    Visit Number 12   Date for PT Re-Evaluation 08/14/15   PT Start Time 0845   PT Stop Time 0949   PT Time Calculation (min) 64 min   Activity Tolerance Patient tolerated treatment well      Past Medical History  Diagnosis Date  . Hyperlipidemia   . GERD (gastroesophageal reflux disease)   . Allergy   . Onychomycosis     No past surgical history on file.  There were no vitals filed for this visit.      Subjective Assessment - 08/01/15 0848    Subjective Still hard to turn to the left.   I especially feel a difference with the manual therapy.  Possible return to work.  Pain on the left with turning to the left.  Sleeping OK.  OK with UE movements.  No longer hurts with sitting.     Currently in Pain? No/denies                         [facility] Adult PT Treatment/Exercise - 08/01/15 0001    Shoulder Exercises: Supine   Other Supine Exercises on green ball:  retractions, nods,    Shoulder Exercises: ROM/Strengthening   UBE (Upper Arm Bike) 8 min Forward/backward seated   Shoulder Exercises: Power Tower   Row 20 reps;10 reps   Row Limitations 35# 3 x10   Other Power Emergency planning/management officer seated 25# 2x15   Moist Heat Therapy   Number Minutes Moist Heat 15 Minutes   Moist Heat Location Cervical   Electrical Stimulation   Electrical Stimulation Location cervical   Electrical Stimulation Action IFC   Electrical Stimulation Parameters 15   Electrical Stimulation Goals Pain   Manual Therapy   Manual Therapy Joint mobilization   Joint Mobilization C3-4 rotation mobs grade 3    Soft tissue  mobilization b cervical paraspinals & UT   Manual Traction 3x 20 sec          Trigger Point Dry Needling - 08/01/15 [id number]    Consent Given? Yes   Muscles Treated Upper Body Suboccipitals muscle group  left multifidi   SubOccipitals Response Palpable increased muscle length                PT Short Term Goals - 08/01/15 1847    PT SHORT TERM GOAL #1   Title be independent in initial HEP   Status Achieved   PT SHORT TERM GOAL #2   Title report a 30% reduction in neck pain with looking up/down to allow for return to work   Status Achieved   PT SHORT TERM GOAL #3   Title demonstrate cervical AROM rotation to 50 degrees to improve ability to look behind with driving   Status Achieved   PT SHORT TERM GOAL #4   Title verbalize and demonstrate correct posture    Status Achieved           PT Long Term Goals - 08/01/15 1847    PT LONG TERM GOAL #1   Title be independent in advanced HEP   Time 8  Period Weeks   Status On-going   PT LONG TERM GOAL #2   Title reduce FOTO to < or = to 33% limitation   Time 8   Period Weeks   Status On-going   PT LONG TERM GOAL #3   Title demonstrate full cervical AROM in all directions to allow for return to work and for safety with driving   Time 8   Period Weeks   Status On-going   PT LONG TERM GOAL #4   Title report a 70% reduction in neck pain with ADLs, driving and work tasks   Time 8   Period Weeks   Status On-going               Plan - 08/01/15 1843    Clinical Impression Statement Continues to have limited left cervical rotation actively and passively.  Pain level continues to decreased overall with ADLS.  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  Improved muscle length following treatment session.  Good pain relief with e-stim/heat.     PT Next Visit Plan assess response to DN #3; manual therapy to increase left cervical rotation;  functional  strengthening; e-stim /heat as needed for pain control and to increase ROM;  Possible e-stim with dry needles      Patient will benefit from skilled therapeutic intervention in order to improve the following deficits and impairments:     Visit Diagnosis: Cervicalgia  Abnormal posture  Cramp and spasm     Problem List Patient Active Problem List   Diagnosis Date Noted  . Neck strain 06/22/2015  . Hyperlipidemia 05/24/2008  . KELOID SCAR 05/24/2008  . HIDRADENITIS SUPPURATIVA 05/24/2008  . HOARSENESS 05/24/2008  . ALLERGIC RHINITIS 12/19/2006  . GERD 11/04/2006    [name] 08/01/2015, 6:48 PM  [facility] [address], [place], [place], [address] Phone: [phone]   Fax:  [phone]  Name: Reginald Simmons MRN: [id number] Date of Birth: [birth year]

## 2015-08-03 ENCOUNTER — Ambulatory Visit: Payer: 59 | Attending: Family Medicine | Admitting: Physical Therapy

## 2015-08-03 ENCOUNTER — Encounter: Payer: Self-pay | Admitting: Physical Therapy

## 2015-08-03 DIAGNOSIS — R252 Cramp and spasm: Secondary | ICD-10-CM

## 2015-08-03 DIAGNOSIS — M542 Cervicalgia: Secondary | ICD-10-CM

## 2015-08-03 DIAGNOSIS — R293 Abnormal posture: Secondary | ICD-10-CM

## 2015-08-03 NOTE — Therapy (Signed)
[facility] [address] [place], [place], [address] Phone: [phone]   Fax:  [phone]  Physical Therapy Treatment  Patient Details  Name: Reginald Simmons MRN: [id number] Date of Birth: 01/19/1971 Referring Provider: Alysia Penna, MD  Encounter Date: 08/03/2015      PT End of Session - 08/03/15 1112    Visit Number 13   Date for PT Re-Evaluation 08/14/15   PT Start Time 1100   PT Stop Time 1202   PT Time Calculation (min) 62 min   Activity Tolerance Patient tolerated treatment well   Behavior During Therapy [facility] for tasks assessed/performed      Past Medical History  Diagnosis Date  . Hyperlipidemia   . GERD (gastroesophageal reflux disease)   . Allergy   . Onychomycosis     History reviewed. No pertinent past surgical history.  There were no vitals filed for this visit.      Subjective Assessment - 08/03/15 1105    Subjective This morning I felt stiff on both sides. The manual therapy always helps. 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     Pertinent History Neck pain began after riding roller coaster 06/02/2015. 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 pain with computer work.  Not limited to time, just aggravates symptoms   Diagnostic tests none   Patient Stated Goals reduce neck pain, not able to exercise, return to work, turn head with driving   Currently in Pain? No/denies                         [facility] Adult PT Treatment/Exercise - 08/03/15 0001    Posture/Postural Control   Posture/Postural Control No significant limitations   Exercises   Exercises Neck;Shoulder   Neck Exercises: Machines for Strengthening   UBE (Upper Arm Bike) L 2 8 minutes (4/4)  sitting on blue physioball   Shoulder Exercises: Supine   Other Supine Exercises flexion/extension ot spine cervical unsupported 2   x10   Shoulder Exercises: Prone   Other Prone Exercises Physioball forward into unil UE ABD/Flex 5# added 3 x 5  each side   Shoulder Exercises: Power Tower   Row 20 reps;10 reps   Row Limitations 35# 3 x10   Other Power Tower Exercises Pectoralis push vertical& horizontal grip 45# 2 x 10each    Modalities   Modalities Electrical Stimulation;Moist Heat   Moist Heat Therapy   Number Minutes Moist Heat 15 Minutes   Moist Heat Location Cervical   Electrical Stimulation   Electrical Stimulation Location cervical   Electrical Stimulation Action IFC   Electrical Stimulation Parameters 15   Electrical Stimulation Goals Pain   Manual Therapy   Manual Therapy Soft tissue mobilization   Soft tissue mobilization b cervical paraspinals & UT   Passive ROM into lat flexion, flexion diagonal flex each side                  PT Short Term Goals - 08/01/15 1847    PT SHORT TERM GOAL #1   Title be independent in initial HEP   Status Achieved   PT SHORT TERM GOAL #2   Title report a 30% reduction in neck pain with looking up/down to allow for return to work   Status Achieved   PT SHORT TERM GOAL #3  Title demonstrate cervical AROM rotation to 50 degrees to improve ability to look behind with driving   Status Achieved   PT SHORT TERM GOAL #4   Title verbalize and demonstrate correct posture    Status Achieved           PT Long Term Goals - 08/01/15 1847    PT LONG TERM GOAL #1   Title be independent in advanced HEP   Time 8   Period Weeks   Status On-going   PT LONG TERM GOAL #2   Title reduce FOTO to < or = to 33% limitation   Time 8   Period Weeks   Status On-going   PT LONG TERM GOAL #3   Title demonstrate full cervical AROM in all directions to allow for return to work and for safety with driving   Time 8   Period Weeks   Status On-going   PT LONG TERM GOAL #4   Title report a 70% reduction in neck pain with ADLs, driving and work tasks   Time 8   Period Weeks    Status On-going               Plan - 08/03/15 1115    Clinical Impression Statement Left cervical rotation is still limited actively and passively. Able to perform moderate exercise level on blue physioball in prone and in supine on green physioball. Pt will continue to benefit from skilled PT to improve flexibility, strength and pain releive.     Rehab Potential Good   PT Frequency 2x / week   PT Duration 8 weeks   PT Treatment/Interventions ADLs/Self Care Home Management;Cryotherapy;Electrical Stimulation;Moist Heat;Therapeutic exercise;Therapeutic activities;Functional mobility training;Ultrasound;Neuromuscular re-education;Patient/family education;Manual techniques;Taping;Dry needling;Passive range of motion   PT Next Visit Plan DN last session helped; manual therapy to increase left cervical rotation;  functional strengthening; e-stim /heat as needed for pain control and to increase ROM;  Possible e-stim with dry needles   Consulted and Agree with Plan of Care Patient      Patient will benefit from skilled therapeutic intervention in order to improve the following deficits and impairments:  Postural dysfunction, Impaired flexibility, Improper body mechanics, Pain, Decreased activity tolerance, Increased muscle spasms, Decreased range of motion  Visit Diagnosis: Cervicalgia  Abnormal posture  Cramp and spasm     Problem List Patient Active Problem List   Diagnosis Date Noted  . Neck strain 06/22/2015  . Hyperlipidemia 05/24/2008  . KELOID SCAR 05/24/2008  . HIDRADENITIS SUPPURATIVA 05/24/2008  . HOARSENESS 05/24/2008  . ALLERGIC RHINITIS 12/19/2006  . GERD 11/04/2006    Simmons,Reginald Lamere PTA 08/03/2015, 12:01 PM  [facility] [address] [place], [place], [address] Phone: [phone]   Fax:  [phone]  Name: Reginald Simmons MRN: [id number] Date of Birth: 1971/01/30

## 2015-08-08 ENCOUNTER — Ambulatory Visit: Payer: 59 | Admitting: Physical Therapy

## 2015-08-08 ENCOUNTER — Encounter: Payer: Self-pay | Admitting: Physical Therapy

## 2015-08-08 DIAGNOSIS — M542 Cervicalgia: Secondary | ICD-10-CM | POA: Diagnosis not present

## 2015-08-08 DIAGNOSIS — R252 Cramp and spasm: Secondary | ICD-10-CM

## 2015-08-08 DIAGNOSIS — R293 Abnormal posture: Secondary | ICD-10-CM

## 2015-08-08 NOTE — Patient Instructions (Signed)
Thoracic Self-Mobilization (Sitting)   With small rolled towel at lower ribs level (Bra area), gently lean back until stretch is felt. Perform 10 x dynamic,  2nd variation is holding it for 20sec x with 3 reps    Do _2-3___ sessions per day.  http://orth.exer.us/998   Copyright  VHI. All rights reserved.  Thoracic Self-Mobilization Stretch (Supine)   With small rolled towel at lower ribs level (Bra area) gently lie back until stretch is felt. Stay there for 3 minutes - you can move both of your arms overhead Relax. Repeat __1__ times per set. Do _1___ sets per session. Do _1___ sessions per day.  http://orth.exer.us/994   Copyright  VHI. All rights reserved.  [place] [address] [employer] [place], [place] [address] Phone # [phone] Fax [phone]

## 2015-08-08 NOTE — Therapy (Signed)
[facility] [address], [place], [place], [address] Phone: [phone]   Fax:  [phone]  Physical Therapy Treatment  Patient Details  Name: Reginald Simmons MRN: [id number] Date of Birth: 07-12-1970 Referring Provider: Alysia Penna, MD  Encounter Date: 08/08/2015      PT End of Session - 08/08/15 0958    Visit Number 14   Date for PT Re-Evaluation 08/14/15   PT Start Time 0930   PT Stop Time 1029   PT Time Calculation (min) 59 min   Activity Tolerance Patient tolerated treatment well   Behavior During Therapy [facility] for tasks assessed/performed      Past Medical History  Diagnosis Date  . Hyperlipidemia   . GERD (gastroesophageal reflux disease)   . Allergy   . Onychomycosis     History reviewed. No pertinent past surgical history.  There were no vitals filed for this visit.      Subjective Assessment - 08/08/15 0937    Subjective The movement to the left side is improving feeling less stiffness. 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    Pertinent History Neck pain began after riding roller coaster 06/02/2015. 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 pain with computer work.  Not limited to time, just aggravates symptoms   Diagnostic tests none   Patient Stated Goals reduce neck pain, not able to exercise, return to work, turn head with driving   Currently in Pain? No/denies                         [facility] Adult PT Treatment/Exercise - 08/08/15 0001    Posture/Postural Control   Posture/Postural Control No significant limitations   Exercises   Exercises Neck;Shoulder   Neck Exercises: Machines for Strengthening   UBE (Upper Arm Bike) L 7 for 4 min L4 for 4 min backward   Neck Exercises: Seated   Cervical Rotation Both;5 reps  whjile sitting on blue ball   Neck Exercises:  Supine   Other Supine Exercise Selfmob with towelroll x 3 min with UE dynamic flexion   Shoulder Exercises: Supine   Other Supine Exercises flexion/extension ot spine cervical unsupported 2  x10  on blue physioball   Shoulder Exercises: Prone   Other Prone Exercises Physioball forward into unil UE ABD/Flex 5# added 3 x 10   Shoulder Exercises: Power Warehouse manager Limitations 55# 3 x10   Other Power Tower Exercises Pectoralis push vertical& horizontal grip 55# 2 x 10each    Modalities   Modalities Electrical Stimulation;Moist Heat   Moist Heat Therapy   Number Minutes Moist Heat 15 Minutes   Moist Heat Location Cervical   Electrical Stimulation   Electrical Stimulation Location cervical   Electrical Stimulation Action IFC   Electrical Stimulation Parameters 15   Electrical Stimulation Goals Pain   Manual Therapy   Manual Therapy Soft tissue mobilization   Soft tissue mobilization b cervical paraspinals & UT   Passive ROM into lat flexion, flexion diagonal flex each side                PT Education - 08/08/15 1011    Education provided Yes   Education Details selfmob with towelroll   Person(s) Educated Patient   Methods Explanation;Demonstration;Handout   Comprehension Verbalized understanding;Returned demonstration  PT Short Term Goals - 08/01/15 1847    PT SHORT TERM GOAL #1   Title be independent in initial HEP   Status Achieved   PT SHORT TERM GOAL #2   Title report a 30% reduction in neck pain with looking up/down to allow for return to work   Status Achieved   PT SHORT TERM GOAL #3   Title demonstrate cervical AROM rotation to 50 degrees to improve ability to look behind with driving   Status Achieved   PT SHORT TERM GOAL #4   Title verbalize and demonstrate correct posture    Status Achieved           PT Long Term Goals - 08/01/15 1847    PT LONG TERM GOAL #1   Title be independent in advanced HEP   Time 8   Period Weeks   Status  On-going   PT LONG TERM GOAL #2   Title reduce FOTO to < or = to 33% limitation   Time 8   Period Weeks   Status On-going   PT LONG TERM GOAL #3   Title demonstrate full cervical AROM in all directions to allow for return to work and for safety with driving   Time 8   Period Weeks   Status On-going   PT LONG TERM GOAL #4   Title report a 70% reduction in neck pain with ADLs, driving and work tasks   Time 8   Period Weeks   Status On-going               Plan - 08/08/15 [id number]    Clinical Impression Statement Left cervical rotation is still limited actively and passively. left shoulder weakness visually observed with exercises.  Able to increase weight with exercises and resistance at UBE. Pt will continue to benefit from skilled PT to improve flexibility, strength and pain relieve.     Rehab Potential Good   PT Frequency 2x / week   PT Duration 8 weeks   PT Treatment/Interventions ADLs/Self Care Home Management;Cryotherapy;Electrical Stimulation;Moist Heat;Therapeutic exercise;Therapeutic activities;Functional mobility training;Ultrasound;Neuromuscular re-education;Patient/family education;Manual techniques;Taping;Dry needling;Passive range of motion   PT Next Visit Plan DN last session helped; manual therapy to increase left cervical rotation;  functional strengthening; e-stim /heat as needed for pain control and to increase ROM;  Possible e-stim with dry needles   Consulted and Agree with Plan of Care Patient      Patient will benefit from skilled therapeutic intervention in order to improve the following deficits and impairments:  Postural dysfunction, Impaired flexibility, Improper body mechanics, Pain, Decreased activity tolerance, Increased muscle spasms, Decreased range of motion  Visit Diagnosis: Cervicalgia  Abnormal posture  Cramp and spasm     Problem List Patient Active Problem List   Diagnosis Date Noted  . Neck strain 06/22/2015  . Hyperlipidemia  05/24/2008  . KELOID SCAR 05/24/2008  . HIDRADENITIS SUPPURATIVA 05/24/2008  . HOARSENESS 05/24/2008  . ALLERGIC RHINITIS 12/19/2006  . GERD 11/04/2006    NAUMANN-HOUEGNIFIO,Linet Brash PTA 08/08/2015, 1:23 PM  [facility] [address], [place], [place], [address] Phone: [phone]   Fax:  [phone]  Name: Reginald Simmons MRN: [id number] Date of Birth: August 08, 1970

## 2015-08-10 ENCOUNTER — Encounter: Payer: 59 | Admitting: Physical Therapy

## 2015-08-11 ENCOUNTER — Ambulatory Visit: Payer: 59 | Admitting: Physical Therapy

## 2015-08-11 ENCOUNTER — Encounter: Payer: Self-pay | Admitting: Physical Therapy

## 2015-08-11 DIAGNOSIS — M542 Cervicalgia: Secondary | ICD-10-CM

## 2015-08-11 DIAGNOSIS — R252 Cramp and spasm: Secondary | ICD-10-CM

## 2015-08-11 DIAGNOSIS — R293 Abnormal posture: Secondary | ICD-10-CM

## 2015-08-11 NOTE — Therapy (Signed)
[facility] [address] [place], [place], [address] Phone: [phone]   Fax:  [phone]  Physical Therapy Treatment  Patient Details  Name: Reginald Simmons MRN: [id number] Date of Birth: [birth year] Referring Provider: Alysia Penna, MD  Encounter Date: 08/11/2015      PT End of Session - 08/11/15 0930    Visit Number 15   Date for PT Re-Evaluation 08/14/15   PT Start Time 0924   PT Stop Time 1025   PT Time Calculation (min) 61 min   Activity Tolerance Patient tolerated treatment well   Behavior During Therapy [facility] for tasks assessed/performed      Past Medical History  Diagnosis Date  . Hyperlipidemia   . GERD (gastroesophageal reflux disease)   . Allergy   . Onychomycosis     History reviewed. No pertinent past surgical history.  There were no vitals filed for this visit.      Subjective Assessment - 08/11/15 0931    Subjective Feeling real good on the right side, the left side has tightness when I turn that way and just a very small ammount of pain.    Currently in Pain? No/denies   Multiple Pain Sites No                         [facility] Adult PT Treatment/Exercise - 08/11/15 0001    Neck Exercises: Machines for Strengthening   UBE (Upper Arm Bike) L 7 for 4 min L4 for 4 min backward   Neck Exercises: Supine   Other Supine Exercise Supine tennis ball release work   Shoulder Exercises: Prone   Other Prone Exercises Physioball forward into unil UE ABD/Flex 5# added 3 x 10  VC/TC on correct form   Shoulder Exercises: Standing   Other Standing Exercises D2 flexion  2 plates 2 [id number]   Shoulder Exercises: Power Warehouse manager Limitations 55# 3 x10   Other Power Tower Exercises Pectoralis push vertical& horizontal grip 55# 2 x 10each    Moist Heat Therapy   Number Minutes Moist Heat 15 Minutes   Moist Heat Location Cervical   Electrical Stimulation   Electrical Stimulation Location  cervical   Electrical Stimulation Action IFC   Electrical Stimulation Parameters 15   Electrical Stimulation Goals Pain                  PT Short Term Goals - 08/01/15 1847    PT SHORT TERM GOAL #1   Title be independent in initial HEP   Status Achieved   PT SHORT TERM GOAL #2   Title report a 30% reduction in neck pain with looking up/down to allow for return to work   Status Achieved   PT SHORT TERM GOAL #3   Title demonstrate cervical AROM rotation to 50 degrees to improve ability to look behind with driving   Status Achieved   PT SHORT TERM GOAL #4   Title verbalize and demonstrate correct posture    Status Achieved           PT Long Term Goals - 08/01/15 1847    PT LONG TERM GOAL #1   Title be independent in advanced HEP   Time 8   Period Weeks   Status On-going   PT LONG TERM GOAL #2   Title reduce FOTO to < or = to 33% limitation   Time 8   Period Weeks   Status  On-going   PT LONG TERM GOAL #3   Title demonstrate full cervical AROM in all directions to allow for return to work and for safety with driving   Time 8   Period Weeks   Status On-going   PT LONG TERM GOAL #4   Title report a 70% reduction in neck pain with ADLs, driving and work tasks   Time 8   Period Weeks   Status On-going               Plan - 08/11/15 0932    Clinical Impression Statement Pretty much similiar to last session which was just 3 days ago. Stiiff looking LT. Added D2 flexion for Lt shoulder today. Alsao educated pt how to use tennis balls at home to perform self soft tissue release work at home.    Rehab Potential Good   PT Frequency 2x / week   PT Duration 8 weeks   PT Treatment/Interventions ADLs/Self Care Home Management;Cryotherapy;Electrical Stimulation;Moist Heat;Therapeutic exercise;Therapeutic activities;Functional mobility training;Ultrasound;Neuromuscular re-education;Patient/family education;Manual techniques;Taping;Dry needling;Passive range of motion    PT Next Visit Plan pt needs renewal visit next    Consulted and Agree with Plan of Care Patient      Patient will benefit from skilled therapeutic intervention in order to improve the following deficits and impairments:  Postural dysfunction, Impaired flexibility, Improper body mechanics, Pain, Decreased activity tolerance, Increased muscle spasms, Decreased range of motion  Visit Diagnosis: Cervicalgia  Abnormal posture  Cramp and spasm     Problem List Patient Active Problem List   Diagnosis Date Noted  . Neck strain 06/22/2015  . Hyperlipidemia 05/24/2008  . KELOID SCAR 05/24/2008  . HIDRADENITIS SUPPURATIVA 05/24/2008  . HOARSENESS 05/24/2008  . ALLERGIC RHINITIS 12/19/2006  . GERD 11/04/2006    Laruen Risser, PTA 08/11/2015, 10:08 AM  [facility] [address], [place], [place], [address] Phone: [phone]   Fax:  [phone]  Name: Reginald Simmons MRN: [id number] Date of Birth: 1970-06-04

## 2015-08-15 ENCOUNTER — Ambulatory Visit: Payer: 59

## 2015-08-15 DIAGNOSIS — R293 Abnormal posture: Secondary | ICD-10-CM

## 2015-08-15 DIAGNOSIS — M542 Cervicalgia: Secondary | ICD-10-CM | POA: Diagnosis not present
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
## 2015-08-17 ENCOUNTER — Encounter: Payer: 59 | Admitting: Physical Therapy

## 2015-08-24 ENCOUNTER — Ambulatory Visit (INDEPENDENT_AMBULATORY_CARE_PROVIDER_SITE_OTHER): Payer: 59 | Admitting: Family Medicine

## 2015-08-24 ENCOUNTER — Encounter: Payer: Self-pay | Admitting: Family Medicine

## 2015-08-24 VITALS — BP 122/84 | HR 77 | Temp 98.3°F | Ht 72.75 in | Wt 314.0 lb

## 2015-08-24 DIAGNOSIS — S161XXD Strain of muscle, fascia and tendon at neck level, subsequent encounter: Secondary | ICD-10-CM

## 2015-08-24 MED ORDER — BECLOMETHASONE DIPROPIONATE 80 MCG/ACT IN AERS: 2.0000 | INHALATION_SPRAY | Freq: Two times a day (BID) | RESPIRATORY_TRACT | Status: DC

## 2015-08-24 NOTE — Progress Notes (Signed)
Pre visit review using our clinic review tool, if applicable. No additional management support is needed unless otherwise documented below in the visit note. 

## 2015-08-25 ENCOUNTER — Encounter: Payer: Self-pay | Admitting: Family Medicine

## 2015-08-25 NOTE — Progress Notes (Signed)
   Subjective:    Patient ID: Reginald Simmons, male    DOB: 1970-10-02, 45 y.o.   MRN: [id number]  HPI    Review of Systems     Objective:   Physical Exam        Assessment & Plan:  Laurey Morale, MD
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
## 2015-10-03 ENCOUNTER — Other Ambulatory Visit: Payer: Self-pay | Admitting: Family Medicine

## 2015-10-09 ENCOUNTER — Telehealth: Payer: Self-pay | Admitting: Family Medicine

## 2015-10-09 MED ORDER — AZITHROMYCIN 250 MG PO TABS: ORAL_TABLET | ORAL | 0 refills | Status: DC

## 2015-10-09 NOTE — Telephone Encounter (Signed)
For chest congestion, cough, and ST

## 2016-07-05 ENCOUNTER — Ambulatory Visit (INDEPENDENT_AMBULATORY_CARE_PROVIDER_SITE_OTHER): Payer: 59 | Admitting: Family Medicine

## 2016-07-05 ENCOUNTER — Encounter: Payer: Self-pay | Admitting: Family Medicine

## 2016-07-05 VITALS — BP 120/78 | HR 99 | Temp 98.9°F | Wt 316.8 lb

## 2016-07-05 DIAGNOSIS — G5603 Carpal tunnel syndrome, bilateral upper limbs: Secondary | ICD-10-CM | POA: Diagnosis not present

## 2016-07-05 MED ORDER — MELOXICAM 15 MG PO TABS: 15.0000 mg | ORAL_TABLET | Freq: Every day | ORAL | 1 refills | Status: DC

## 2016-07-05 NOTE — Progress Notes (Signed)
Pre visit review using our clinic review tool, if applicable. No additional management support is needed unless otherwise documented below in the visit note. 

## 2016-07-05 NOTE — Progress Notes (Signed)
   Subjective:    Patient ID: Reginald Simmons, male    DOB: 04-Mar-1971, 46 y.o.   MRN: [id number]  HPI Here for 6 months of steadily worsening symptoms in both hands, the right being worse than the left. These included numbness, tingling, stiffness, and pain. He often wakes up in the morning with the right hand being so stiff he cannot open is fingers all the way. Then it loosens up over the next hour. 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 No such symptoms in the feet.    Review of Systems  Respiratory: Negative.   Cardiovascular: Negative.   Musculoskeletal: Positive for arthralgias. Negative for neck pain and neck stiffness.  Neurological: Positive for weakness and numbness.       Objective:   Physical Exam  Constitutional: He is oriented to person, place, and time. He appears well-developed and well-nourished.  Cardiovascular: Normal rate, regular rhythm, normal heart sounds and intact distal pulses.   Pulmonary/Chest: Effort normal and breath sounds normal.  Musculoskeletal:  The right hand is slightly tender when I squeeze it. No swelling. No joint tenderness. Both wrists are benign.   Neurological: He is alert and oriented to person, place, and time.          Assessment & Plan:  Carpal tunnel syndrome. He will get some wrist splints and will wear these continuously when he is not working, even in bed at night. Start on Meloxicam daily. Recheck next week when he returns for a well exam.  Alysia Penna, MD

## 2016-07-05 NOTE — Patient Instructions (Signed)
WE NOW OFFER   [facility] FAST TRACK!!!  SAME DAY Appointments for ACUTE CARE  Such as: Sprains, Injuries, cuts, abrasions, rashes, muscle pain, joint pain, back pain Colds, flu, sore throats, headache, allergies, cough, fever  Ear pain, sinus and eye infections Abdominal pain, nausea, vomiting, diarrhea, upset stomach Animal/insect bites  3 Easy Ways to Schedule: Walk-In Scheduling Call in scheduling Mychart Sign-up: [email or web address]         

## 2016-07-12 ENCOUNTER — Encounter: Payer: 59 | Admitting: Family Medicine

## 2016-11-01 ENCOUNTER — Encounter: Payer: Self-pay | Admitting: Family Medicine

## 2016-11-01 ENCOUNTER — Ambulatory Visit (INDEPENDENT_AMBULATORY_CARE_PROVIDER_SITE_OTHER): Payer: 59 | Admitting: Family Medicine

## 2016-11-01 VITALS — BP 116/74 | Temp 98.4°F | Ht 72.75 in | Wt 311.0 lb

## 2016-11-01 DIAGNOSIS — Z0000 Encounter for general adult medical examination without abnormal findings: Secondary | ICD-10-CM | POA: Diagnosis not present

## 2016-11-01 LAB — CBC WITH DIFFERENTIAL/PLATELET
BASOS ABS: 0 10*3/uL (ref 0.0–0.1)
Basophils Relative: 0.3 % (ref 0.0–3.0)
EOS ABS: 0 10*3/uL (ref 0.0–0.7)
EOS PCT: 0.8 % (ref 0.0–5.0)
HCT: 46.9 % (ref 39.0–52.0)
Hemoglobin: 15.4 g/dL (ref 13.0–17.0)
LYMPHS ABS: 1.2 10*3/uL (ref 0.7–4.0)
Lymphocytes Relative: 21.6 % (ref 12.0–46.0)
MCHC: 32.9 g/dL (ref 30.0–36.0)
MCV: 91.3 fl (ref 78.0–100.0)
MONO ABS: 0.6 10*3/uL (ref 0.1–1.0)
Monocytes Relative: 11.2 % (ref 3.0–12.0)
NEUTROS PCT: 66.1 % (ref 43.0–77.0)
Neutro Abs: 3.8 10*3/uL (ref 1.4–7.7)
Platelets: 123 10*3/uL — ABNORMAL LOW (ref 150.0–400.0)
RBC: 5.13 Mil/uL (ref 4.22–5.81)
RDW: 14.9 % (ref 11.5–15.5)
WBC: 5.7 10*3/uL (ref 4.0–10.5)

## 2016-11-01 LAB — POC URINALSYSI DIPSTICK (AUTOMATED)
BILIRUBIN UA: NEGATIVE
Blood, UA: NEGATIVE
GLUCOSE UA: NEGATIVE
KETONES UA: NEGATIVE
Leukocytes, UA: NEGATIVE
Nitrite, UA: NEGATIVE
Urobilinogen, UA: 0.2 E.U./dL
pH, UA: 6 (ref 5.0–8.0)

## 2016-11-01 LAB — BASIC METABOLIC PANEL
BUN: 16 mg/dL (ref 6–23)
CALCIUM: 9.1 mg/dL (ref 8.4–10.5)
CO2: 29 meq/L (ref 19–32)
Chloride: 102 mEq/L (ref 96–112)
Creatinine, Ser: 0.95 mg/dL (ref 0.40–1.50)
GFR: 110.43 mL/min (ref >60.00)
GLUCOSE: 97 mg/dL (ref 70–99)
Potassium: 3.8 mEq/L (ref 3.5–5.1)
SODIUM: 138 meq/L (ref 135–145)

## 2016-11-01 LAB — PSA: PSA: 3.43 ng/mL (ref 0.10–4.00)

## 2016-11-01 LAB — HEPATIC FUNCTION PANEL
ALT: 42 U/L (ref 0–53)
AST: 31 U/L (ref 0–37)
Albumin: 4.2 g/dL (ref 3.5–5.2)
Alkaline Phosphatase: 60 U/L (ref 39–117)
BILIRUBIN DIRECT: 0.2 mg/dL (ref 0.0–0.3)
TOTAL PROTEIN: 7.3 g/dL (ref 6.0–8.3)
Total Bilirubin: 0.7 mg/dL (ref 0.2–1.2)

## 2016-11-01 LAB — LIPID PANEL
CHOLESTEROL: 265 mg/dL — AB (ref 0–200)
HDL: 52 mg/dL (ref >39.00)
LDL Cholesterol: 196 mg/dL — ABNORMAL HIGH (ref 0–99)
NonHDL: 213.46
TRIGLYCERIDES: 86 mg/dL (ref 0.0–149.0)
Total CHOL/HDL Ratio: 5
VLDL: 17.2 mg/dL (ref 0.0–40.0)

## 2016-11-01 LAB — TSH: TSH: 1.36 u[IU]/mL (ref 0.35–4.50)

## 2016-11-01 MED ORDER — BECLOMETHASONE DIPROPIONATE 80 MCG/ACT IN AERS: 2.0000 | INHALATION_SPRAY | Freq: Two times a day (BID) | RESPIRATORY_TRACT | 3 refills | Status: DC

## 2016-11-01 MED ORDER — ATORVASTATIN CALCIUM 20 MG PO TABS: 20.0000 mg | ORAL_TABLET | Freq: Every day | ORAL | 3 refills | Status: DC

## 2016-11-01 MED ORDER — MELOXICAM 15 MG PO TABS: 15.0000 mg | ORAL_TABLET | Freq: Every day | ORAL | 1 refills | Status: DC

## 2016-11-01 MED ORDER — BECLOMETHASONE DIPROP HFA 80 MCG/ACT IN AERB: 2.0000 | INHALATION_SPRAY | Freq: Two times a day (BID) | RESPIRATORY_TRACT | 3 refills | Status: DC

## 2016-11-01 NOTE — Progress Notes (Signed)
   Subjective:    Patient ID: Reginald Simmons, male    DOB: [birth year], 46 y.o.   MRN: [id number]  HPI Here for a well exam. He feels well. He knows he needs to lose weight.    Review of Systems  Constitutional: Negative.   HENT: Negative.   Eyes: Negative.   Respiratory: Negative.   Cardiovascular: Negative.   Gastrointestinal: Negative.   Genitourinary: Negative.   Musculoskeletal: Negative.   Skin: Negative.   Neurological: Negative.   Psychiatric/Behavioral: Negative.        Objective:   Physical Exam  Constitutional: He is oriented to person, place, and time. He appears well-developed and well-nourished. No distress.  HENT:  Head: Normocephalic and atraumatic.  Right Ear: External ear normal.  Left Ear: External ear normal.  Nose: Nose normal.  Mouth/Throat: Oropharynx is clear and moist. No oropharyngeal exudate.  Eyes: Pupils are equal, round, and reactive to light. Conjunctivae and EOM are normal. Right eye exhibits no discharge. Left eye exhibits no discharge. No scleral icterus.  Neck: Neck supple. No JVD present. No tracheal deviation present. No thyromegaly present.  Cardiovascular: Normal rate, regular rhythm, normal heart sounds and intact distal pulses.  Exam reveals no gallop and no friction rub.   No murmur heard. Pulmonary/Chest: Effort normal and breath sounds normal. No respiratory distress. He has no wheezes. He has no rales. He exhibits no tenderness.  Abdominal: Soft. Bowel sounds are normal. He exhibits no distension and no mass. There is no tenderness. There is no rebound and no guarding.  Genitourinary: Rectum normal, prostate normal and penis normal. Rectal exam shows guaiac negative stool. No penile tenderness.  Musculoskeletal: Normal range of motion. He exhibits no edema or tenderness.  Lymphadenopathy:    He has no cervical adenopathy.  Neurological: He is alert and oriented to person, place, and time. He has normal reflexes. No cranial nerve  deficit. He exhibits normal muscle tone. Coordination normal.  Skin: Skin is warm and dry. No rash noted. He is not diaphoretic. No erythema. No pallor.  Psychiatric: He has a normal mood and affect. His behavior is normal. Judgment and thought content normal.          Assessment & Plan:  Well exam. We discussed diet and exercise. Get fasting labs.  Alysia Penna, MD

## 2016-11-01 NOTE — Patient Instructions (Signed)
WE NOW OFFER   [facility] FAST TRACK!!!  SAME DAY Appointments for ACUTE CARE  Such as: Sprains, Injuries, cuts, abrasions, rashes, muscle pain, joint pain, back pain Colds, flu, sore throats, headache, allergies, cough, fever  Ear pain, sinus and eye infections Abdominal pain, nausea, vomiting, diarrhea, upset stomach Animal/insect bites  3 Easy Ways to Schedule: Walk-In Scheduling Call in scheduling Mychart Sign-up: [email or web address]         

## 2017-10-27 ENCOUNTER — Encounter: Payer: Self-pay | Admitting: Family Medicine

## 2017-10-27 ENCOUNTER — Ambulatory Visit (INDEPENDENT_AMBULATORY_CARE_PROVIDER_SITE_OTHER): Payer: 59 | Admitting: Family Medicine

## 2017-10-27 VITALS — BP 110/84 | HR 85 | Temp 98.5°F | Ht 72.75 in | Wt 316.0 lb

## 2017-10-27 DIAGNOSIS — G629 Polyneuropathy, unspecified: Secondary | ICD-10-CM

## 2017-10-27 MED ORDER — DOXYCYCLINE HYCLATE 100 MG PO CAPS: 100.0000 mg | ORAL_CAPSULE | Freq: Two times a day (BID) | ORAL | 1 refills | Status: AC

## 2017-10-27 NOTE — Progress Notes (Signed)
   Subjective:    Patient ID: OGLE HOEFFNER, male    DOB: 09/18/1970, 47 y.o.   MRN: [id number]  HPI Here for one week of constant numbness and tingling in the lateral left foot and the left heel. No pain or swelling. No recent trauma. No numbness elsewhere in the body. No recent change in footwear. He does note that he started working out at the gym 3 days a week about one week before this started. He primarily rides an eliptical machine and works on his torso. No weight lifting.    Review of Systems  Constitutional: Negative.   Respiratory: Negative.   Cardiovascular: Negative.   Musculoskeletal: Negative.   Neurological: Positive for numbness. Negative for weakness.       Objective:   Physical Exam  Constitutional: He is oriented to person, place, and time. He appears well-developed and well-nourished.  Cardiovascular: Normal rate, regular rhythm, normal heart sounds and intact distal pulses.  Pulmonary/Chest: Effort normal and breath sounds normal.  Neurological: He is alert and oriented to person, place, and time. No cranial nerve deficit. He exhibits normal muscle tone. Coordination normal.  He has decreased light touch sensation along the lateral left foot, in the mid arch region, and over the inferior heel           Assessment & Plan:  Numbness, consistent with a peroneal neuropathy. We will get labs today to screen glucose, B12, etc.  Alysia Penna, MD

## 2017-10-28 LAB — CBC WITH DIFFERENTIAL/PLATELET
BASOS ABS: 0.1 10*3/uL (ref 0.0–0.1)
BASOS PCT: 1 % (ref 0.0–3.0)
EOS ABS: 0 10*3/uL (ref 0.0–0.7)
Eosinophils Relative: 0.8 % (ref 0.0–5.0)
HEMATOCRIT: 43.1 % (ref 39.0–52.0)
HEMOGLOBIN: 14.3 g/dL (ref 13.0–17.0)
LYMPHS PCT: 27.9 % (ref 12.0–46.0)
Lymphs Abs: 1.4 10*3/uL (ref 0.7–4.0)
MCHC: 33.2 g/dL (ref 30.0–36.0)
MCV: 90.7 fl (ref 78.0–100.0)
Monocytes Absolute: 0.5 10*3/uL (ref 0.1–1.0)
Monocytes Relative: 10.4 % (ref 3.0–12.0)
Neutro Abs: 3 10*3/uL (ref 1.4–7.7)
Neutrophils Relative %: 59.9 % (ref 43.0–77.0)
Platelets: 125 10*3/uL — ABNORMAL LOW (ref 150.0–400.0)
RBC: 4.75 Mil/uL (ref 4.22–5.81)
RDW: 15.1 % (ref 11.5–15.5)
WBC: 5 10*3/uL (ref 4.0–10.5)

## 2017-10-28 LAB — BASIC METABOLIC PANEL
BUN: 20 mg/dL (ref 6–23)
CALCIUM: 9.3 mg/dL (ref 8.4–10.5)
CO2: 31 mEq/L (ref 19–32)
Chloride: 104 mEq/L (ref 96–112)
Creatinine, Ser: 1 mg/dL (ref 0.40–1.50)
GFR: 103.62 mL/min (ref >60.00)
Glucose, Bld: 84 mg/dL (ref 70–99)
POTASSIUM: 4 meq/L (ref 3.5–5.1)
SODIUM: 140 meq/L (ref 135–145)

## 2017-10-28 LAB — TSH: TSH: 2.24 u[IU]/mL (ref 0.35–4.50)

## 2017-10-28 LAB — HEPATIC FUNCTION PANEL
ALT: 37 U/L (ref 0–53)
AST: 26 U/L (ref 0–37)
Albumin: 4 g/dL (ref 3.5–5.2)
Alkaline Phosphatase: 59 U/L (ref 39–117)
BILIRUBIN DIRECT: 0.1 mg/dL (ref 0.0–0.3)
TOTAL PROTEIN: 7.6 g/dL (ref 6.0–8.3)
Total Bilirubin: 0.3 mg/dL (ref 0.2–1.2)

## 2017-10-28 LAB — VITAMIN B12: Vitamin B-12: 357 pg/mL (ref 211–911)

## 2017-10-29 ENCOUNTER — Encounter: Payer: Self-pay | Admitting: *Deleted

## 2017-10-29 ENCOUNTER — Telehealth: Payer: Self-pay | Admitting: Family Medicine

## 2017-10-29 NOTE — Telephone Encounter (Signed)
Copied from [place] [id number]. Topic: Quick Communication - See Telephone Encounter >> Oct 29, 2017  3:42 PM Percell Belt A wrote: CRM for notification. See Telephone encounter for: 10/29/17.  Pt called in stated that he is aware of lab results but would like to know if there is anything he can take or do for the tingling in his feet?    Call back number [phone]

## 2017-11-04 MED ORDER — GABAPENTIN 100 MG PO CAPS: 100.0000 mg | ORAL_CAPSULE | Freq: Three times a day (TID) | ORAL | 2 refills | Status: DC

## 2017-11-04 NOTE — Telephone Encounter (Signed)
Dr. Sarajane Jews,  Pt is wanting to know if there is anything that he can do for the tingling in his feet?  Please advise. Thanks

## 2017-11-04 NOTE — Telephone Encounter (Signed)
Rx has been sent into [facility] at [place].  Called pt and left a detailed voice advised pt to call back if needed.

## 2017-11-04 NOTE — Telephone Encounter (Signed)
Yes, call in Gabapentin 100 mg to take TID, #90 with 2 rf. Tell him we can increase the dose after 2 weeks if this does not help

## 2017-11-07 ENCOUNTER — Encounter: Payer: Self-pay | Admitting: Family Medicine

## 2017-11-07 ENCOUNTER — Ambulatory Visit (INDEPENDENT_AMBULATORY_CARE_PROVIDER_SITE_OTHER): Payer: 59 | Admitting: Family Medicine

## 2017-11-07 VITALS — BP 118/80 | HR 71 | Temp 98.6°F | Ht 73.0 in | Wt 305.2 lb

## 2017-11-07 DIAGNOSIS — Z0000 Encounter for general adult medical examination without abnormal findings: Secondary | ICD-10-CM | POA: Diagnosis not present

## 2017-11-07 LAB — POC URINALSYSI DIPSTICK (AUTOMATED)
Bilirubin, UA: NEGATIVE
Blood, UA: NEGATIVE
GLUCOSE UA: NEGATIVE
Ketones, UA: NEGATIVE
Leukocytes, UA: NEGATIVE
NITRITE UA: NEGATIVE
PH UA: 7 (ref 5.0–8.0)
PROTEIN UA: POSITIVE — AB
Spec Grav, UA: 1.02 (ref 1.010–1.025)
Urobilinogen, UA: 0.2 E.U./dL

## 2017-11-07 LAB — HEPATIC FUNCTION PANEL
ALBUMIN: 4.4 g/dL (ref 3.5–5.2)
ALK PHOS: 62 U/L (ref 39–117)
ALT: 38 U/L (ref 0–53)
AST: 27 U/L (ref 0–37)
Bilirubin, Direct: 0.1 mg/dL (ref 0.0–0.3)
Total Bilirubin: 0.9 mg/dL (ref 0.2–1.2)
Total Protein: 7.6 g/dL (ref 6.0–8.3)

## 2017-11-07 LAB — LIPID PANEL
CHOLESTEROL: 233 mg/dL — AB (ref 0–200)
HDL: 54.7 mg/dL (ref >39.00)
LDL Cholesterol: 162 mg/dL — ABNORMAL HIGH (ref 0–99)
NonHDL: 177.94
TRIGLYCERIDES: 81 mg/dL (ref 0.0–149.0)
Total CHOL/HDL Ratio: 4
VLDL: 16.2 mg/dL (ref 0.0–40.0)

## 2017-11-07 LAB — CBC WITH DIFFERENTIAL/PLATELET
Basophils Absolute: 0 10*3/uL (ref 0.0–0.1)
Basophils Relative: 0.5 % (ref 0.0–3.0)
EOS PCT: 0.8 % (ref 0.0–5.0)
Eosinophils Absolute: 0 10*3/uL (ref 0.0–0.7)
HCT: 45.7 % (ref 39.0–52.0)
Hemoglobin: 15.3 g/dL (ref 13.0–17.0)
Lymphocytes Relative: 22.3 % (ref 12.0–46.0)
Lymphs Abs: 1.1 10*3/uL (ref 0.7–4.0)
MCHC: 33.5 g/dL (ref 30.0–36.0)
MCV: 89.9 fl (ref 78.0–100.0)
MONOS PCT: 10.9 % (ref 3.0–12.0)
Monocytes Absolute: 0.5 10*3/uL (ref 0.1–1.0)
NEUTROS ABS: 3.2 10*3/uL (ref 1.4–7.7)
NEUTROS PCT: 65.5 % (ref 43.0–77.0)
PLATELETS: 105 10*3/uL — AB (ref 150.0–400.0)
RBC: 5.08 Mil/uL (ref 4.22–5.81)
RDW: 15.1 % (ref 11.5–15.5)
WBC: 4.9 10*3/uL (ref 4.0–10.5)

## 2017-11-07 LAB — TSH: TSH: 0.74 u[IU]/mL (ref 0.35–4.50)

## 2017-11-07 LAB — BASIC METABOLIC PANEL
BUN: 17 mg/dL (ref 6–23)
CHLORIDE: 104 meq/L (ref 96–112)
CO2: 31 meq/L (ref 19–32)
CREATININE: 0.91 mg/dL (ref 0.40–1.50)
Calcium: 9.2 mg/dL (ref 8.4–10.5)
GFR: 115.52 mL/min (ref >60.00)
Glucose, Bld: 97 mg/dL (ref 70–99)
POTASSIUM: 4.3 meq/L (ref 3.5–5.1)
Sodium: 141 mEq/L (ref 135–145)

## 2017-11-07 LAB — PSA: PSA: 2.83 ng/mL (ref 0.10–4.00)

## 2017-11-07 NOTE — Progress Notes (Signed)
   Subjective:    Patient ID: Reginald Simmons, male    DOB: [birth year], 47 y.o.   MRN: [id number]  HPI Here for a well exam. He feels good. He is trying to lose weight.    Review of Systems  Constitutional: Negative.   HENT: Negative.   Eyes: Negative.   Respiratory: Negative.   Cardiovascular: Negative.   Gastrointestinal: Negative.   Genitourinary: Negative.   Musculoskeletal: Negative.   Skin: Negative.   Neurological: Negative.   Psychiatric/Behavioral: Negative.        Objective:   Physical Exam  Constitutional: He is oriented to person, place, and time. He appears well-developed and well-nourished. No distress.  HENT:  Head: Normocephalic and atraumatic.  Right Ear: External ear normal.  Left Ear: External ear normal.  Nose: Nose normal.  Mouth/Throat: Oropharynx is clear and moist. No oropharyngeal exudate.  Eyes: Pupils are equal, round, and reactive to light. Conjunctivae and EOM are normal. Right eye exhibits no discharge. Left eye exhibits no discharge. No scleral icterus.  Neck: Neck supple. No JVD present. No tracheal deviation present. No thyromegaly present.  Cardiovascular: Normal rate, regular rhythm, normal heart sounds and intact distal pulses. Exam reveals no gallop and no friction rub.  No murmur heard. Pulmonary/Chest: Effort normal and breath sounds normal. No respiratory distress. He has no wheezes. He has no rales. He exhibits no tenderness.  Abdominal: Soft. Bowel sounds are normal. He exhibits no distension and no mass. There is no tenderness. There is no rebound and no guarding.  Genitourinary: Rectum normal, prostate normal and penis normal. Rectal exam shows guaiac negative stool. No penile tenderness.  Musculoskeletal: Normal range of motion. He exhibits no edema or tenderness.  Lymphadenopathy:    He has no cervical adenopathy.  Neurological: He is alert and oriented to person, place, and time. He has normal reflexes. He displays normal  reflexes. No cranial nerve deficit. He exhibits normal muscle tone. Coordination normal.  Skin: Skin is warm and dry. No rash noted. He is not diaphoretic. No erythema. No pallor.  Psychiatric: He has a normal mood and affect. His behavior is normal. Judgment and thought content normal.          Assessment & Plan:  Well exam. We discussed diet and exercise. Get fasting labs. Alysia Penna, MD

## 2017-11-12 ENCOUNTER — Encounter: Payer: Self-pay | Admitting: *Deleted

## 2017-11-28 ENCOUNTER — Other Ambulatory Visit: Payer: Self-pay

## 2017-11-28 MED ORDER — ATORVASTATIN CALCIUM 40 MG PO TABS: 40.0000 mg | ORAL_TABLET | Freq: Every day | ORAL | 5 refills | Status: DC

## 2017-11-30 ENCOUNTER — Observation Stay (HOSPITAL_COMMUNITY)
Admission: EM | Admit: 2017-11-30 | Discharge: 2017-12-01 | Disposition: A | Discharge status: Home / Self Care | Payer: 59 | Attending: Internal Medicine | Admitting: Internal Medicine

## 2017-11-30 ENCOUNTER — Emergency Department (HOSPITAL_COMMUNITY): Payer: 59

## 2017-11-30 ENCOUNTER — Other Ambulatory Visit: Payer: Self-pay

## 2017-11-30 ENCOUNTER — Encounter (HOSPITAL_COMMUNITY): Payer: Self-pay | Admitting: Emergency Medicine

## 2017-11-30 DIAGNOSIS — G629 Polyneuropathy, unspecified: Secondary | ICD-10-CM | POA: Diagnosis not present

## 2017-11-30 DIAGNOSIS — R079 Chest pain, unspecified: Secondary | ICD-10-CM | POA: Diagnosis present

## 2017-11-30 DIAGNOSIS — Z6838 Body mass index (BMI) 38.0-38.9, adult: Secondary | ICD-10-CM | POA: Insufficient documentation

## 2017-11-30 DIAGNOSIS — Z87891 Personal history of nicotine dependence: Secondary | ICD-10-CM | POA: Diagnosis not present

## 2017-11-30 DIAGNOSIS — E669 Obesity, unspecified: Secondary | ICD-10-CM | POA: Diagnosis not present

## 2017-11-30 DIAGNOSIS — K219 Gastro-esophageal reflux disease without esophagitis: Secondary | ICD-10-CM | POA: Insufficient documentation

## 2017-11-30 DIAGNOSIS — R001 Bradycardia, unspecified: Secondary | ICD-10-CM | POA: Insufficient documentation

## 2017-11-30 DIAGNOSIS — Z823 Family history of stroke: Secondary | ICD-10-CM | POA: Insufficient documentation

## 2017-11-30 DIAGNOSIS — Z8249 Family history of ischemic heart disease and other diseases of the circulatory system: Secondary | ICD-10-CM | POA: Diagnosis not present

## 2017-11-30 DIAGNOSIS — R0789 Other chest pain: Principal | ICD-10-CM | POA: Insufficient documentation

## 2017-11-30 DIAGNOSIS — Z79899 Other long term (current) drug therapy: Secondary | ICD-10-CM | POA: Insufficient documentation

## 2017-11-30 DIAGNOSIS — E785 Hyperlipidemia, unspecified: Secondary | ICD-10-CM | POA: Diagnosis not present

## 2017-11-30 DIAGNOSIS — E162 Hypoglycemia, unspecified: Secondary | ICD-10-CM | POA: Diagnosis not present

## 2017-11-30 DIAGNOSIS — D696 Thrombocytopenia, unspecified: Secondary | ICD-10-CM | POA: Insufficient documentation

## 2017-11-30 DIAGNOSIS — E161 Other hypoglycemia: Secondary | ICD-10-CM | POA: Diagnosis not present

## 2017-11-30 LAB — BASIC METABOLIC PANEL
Anion gap: 8 (ref 5–15)
BUN: 17 mg/dL (ref 6–20)
CALCIUM: 8.6 mg/dL — AB (ref 8.9–10.3)
CO2: 25 mmol/L (ref 22–32)
CREATININE: 1.16 mg/dL (ref 0.61–1.24)
Chloride: 105 mmol/L (ref 98–111)
GLUCOSE: 106 mg/dL — AB (ref 70–99)
Potassium: 4.1 mmol/L (ref 3.5–5.1)
Sodium: 138 mmol/L (ref 135–145)

## 2017-11-30 LAB — I-STAT TROPONIN, ED: Troponin i, poc: 0 ng/mL (ref 0.00–0.08)

## 2017-11-30 NOTE — ED Triage Notes (Signed)
Pt to triage via [facility]. C/o dizziness and chest pressure x 1 hour.  Denies nausea, vomiting, and SOB.  EMS administered ASA 324mg .

## 2017-12-01 ENCOUNTER — Observation Stay (HOSPITAL_BASED_OUTPATIENT_CLINIC_OR_DEPARTMENT_OTHER): Payer: 59

## 2017-12-01 ENCOUNTER — Other Ambulatory Visit: Payer: Self-pay

## 2017-12-01 ENCOUNTER — Encounter (HOSPITAL_COMMUNITY): Payer: Self-pay | Admitting: Internal Medicine

## 2017-12-01 DIAGNOSIS — E785 Hyperlipidemia, unspecified: Secondary | ICD-10-CM

## 2017-12-01 DIAGNOSIS — E669 Obesity, unspecified: Secondary | ICD-10-CM

## 2017-12-01 DIAGNOSIS — D696 Thrombocytopenia, unspecified: Secondary | ICD-10-CM

## 2017-12-01 DIAGNOSIS — R079 Chest pain, unspecified: Secondary | ICD-10-CM | POA: Diagnosis not present

## 2017-12-01 LAB — HEPATIC FUNCTION PANEL
ALT: 37 U/L (ref 0–44)
AST: 27 U/L (ref 15–41)
Albumin: 3.5 g/dL (ref 3.5–5.0)
Alkaline Phosphatase: 45 U/L (ref 38–126)
BILIRUBIN INDIRECT: 0.6 mg/dL (ref 0.3–0.9)
Bilirubin, Direct: 0.1 mg/dL (ref 0.0–0.2)
TOTAL PROTEIN: 6.6 g/dL (ref 6.5–8.1)
Total Bilirubin: 0.7 mg/dL (ref 0.3–1.2)

## 2017-12-01 LAB — RAPID URINE DRUG SCREEN, HOSP PERFORMED
AMPHETAMINES: NOT DETECTED
Barbiturates: NOT DETECTED
Benzodiazepines: NOT DETECTED
COCAINE: NOT DETECTED
OPIATES: NOT DETECTED
TETRAHYDROCANNABINOL: NOT DETECTED

## 2017-12-01 LAB — CREATININE, SERUM
CREATININE: 1.04 mg/dL (ref 0.61–1.24)
GFR calc Af Amer: >60 mL/min (ref >60)

## 2017-12-01 LAB — EXERCISE TOLERANCE TEST
CSEPED: 8 min
Estimated workload: 10.1 METS
Exercise duration (sec): 43 s
MPHR: 175 {beats}/min
Peak HR: 164 {beats}/min
Percent HR: 93 %
RPE: 13
Rest HR: 50 {beats}/min

## 2017-12-01 LAB — CBC
HCT: 42.3 % (ref 39.0–52.0)
HEMATOCRIT: 42.3 % (ref 39.0–52.0)
Hemoglobin: 13.7 g/dL (ref 13.0–17.0)
Hemoglobin: 13.5 g/dL (ref 13.0–17.0)
MCH: 29.7 pg (ref 26.0–34.0)
MCH: 29.4 pg (ref 26.0–34.0)
MCHC: 32.4 g/dL (ref 30.0–36.0)
MCHC: 31.9 g/dL (ref 30.0–36.0)
MCV: 91.8 fL (ref 78.0–100.0)
MCV: 92.2 fL (ref 78.0–100.0)
PLATELETS: 108 10*3/uL — AB (ref 150–400)
Platelets: 116 10*3/uL — ABNORMAL LOW (ref 150–400)
RBC: 4.61 MIL/uL (ref 4.22–5.81)
RBC: 4.59 MIL/uL (ref 4.22–5.81)
RDW: 14.6 % (ref 11.5–15.5)
RDW: 14.3 % (ref 11.5–15.5)
WBC: 3.9 10*3/uL — AB (ref 4.0–10.5)
WBC: 4.5 10*3/uL (ref 4.0–10.5)

## 2017-12-01 LAB — TSH: TSH: 3.645 u[IU]/mL (ref 0.350–4.500)

## 2017-12-01 LAB — D-DIMER, QUANTITATIVE: D-Dimer, Quant: 0.98 ug/mL-FEU — ABNORMAL HIGH (ref 0.00–0.50)

## 2017-12-01 LAB — I-STAT TROPONIN, ED: Troponin i, poc: 0 ng/mL (ref 0.00–0.08)

## 2017-12-01 LAB — HIV ANTIBODY (ROUTINE TESTING W REFLEX): HIV SCREEN 4TH GENERATION: NONREACTIVE

## 2017-12-01 LAB — TROPONIN I
Troponin I: <0.03 ng/mL (ref <0.03)
Troponin I: <0.03 ng/mL (ref <0.03)

## 2017-12-01 LAB — LIPASE, BLOOD: Lipase: 29 U/L (ref 11–51)

## 2017-12-01 MED ORDER — ACETAMINOPHEN 325 MG PO TABS: 650.0000 mg | ORAL_TABLET | ORAL | Status: DC | PRN

## 2017-12-01 MED ORDER — ONDANSETRON HCL 4 MG/2ML IJ SOLN: 4.0000 mg | Freq: Four times a day (QID) | INTRAMUSCULAR | Status: DC | PRN

## 2017-12-01 MED ORDER — ENOXAPARIN SODIUM 40 MG/0.4ML ~~LOC~~ SOLN: 40.0000 mg | SUBCUTANEOUS | Status: DC

## 2017-12-01 MED ORDER — ATORVASTATIN CALCIUM 40 MG PO TABS: 40.0000 mg | ORAL_TABLET | Freq: Every day | ORAL | Status: DC

## 2017-12-01 NOTE — H&P (Addendum)
History and Physical    Reginald Simmons KPT:[id number] DOB: [birth year] DOA: 11/30/2017  PCP: Laurey Morale, MD  Patient coming from: Home.  Chief Complaint: Chest pain.  HPI: Reginald Simmons is a 47 y.o. male with history of hyperlipidemia and recently diagnosed neuropathy of the lower extremity who has not been taking any medications last night when about to go to bed after doing his chores started feeling diaphoretic and dizzy and eventually started developing chest pressure across the chest.  Denies any shortness of breath palpitations.  Patient called EMS and symptoms was persistent.  EMS gave him aspirin and was brought to the ER.  Patient states last 2 weeks he has started on a new keto diet.  ED Course: Hypernatremia patient symptoms resolved.  EKG shows sinus bradycardia.  Chest x-ray unremarkable troponin negative.  Patient given the risk factors including hyperlipidemia morbid obesity and patient's mother having MI at age 47 admitted for further observation.  Review of Systems: As per HPI, rest all negative.   Past Medical History:  Diagnosis Date  . Allergy   . GERD (gastroesophageal reflux disease)   . Hyperlipidemia   . Onychomycosis     History reviewed. No pertinent surgical history.   reports that he has quit smoking. He has never used smokeless tobacco. He reports that he does not drink alcohol or use drugs.  No Known Allergies  Family History  Problem Relation Age of Onset  . Hypertension Unknown   . Stroke Unknown   . Heart disease Unknown     Prior to Admission medications   Medication Sig Start Date End Date Taking? Authorizing Provider  beclomethasone (QVAR REDIHALER) 80 MCG/ACT inhaler Inhale 2 puffs into the lungs 2 (two) times daily. Patient taking differently: Inhale 2 puffs into the lungs 2 (two) times daily as needed (SOB).  11/01/16  Yes Laurey Morale, MD  cetirizine (ZYRTEC) 10 MG tablet Take 10 mg by mouth daily as needed for allergies.    Yes  [provider]  Cyanocobalamin (VITAMIN B-12 PO) Take 1-3 tablets by mouth daily.   Yes [provider]  meloxicam (MOBIC) 15 MG tablet Take 1 tablet (15 mg total) by mouth daily. 11/01/16  Yes Laurey Morale, MD  atorvastatin (LIPITOR) 40 MG tablet Take 1 tablet (40 mg total) by mouth daily. Patient not taking: Reported on 12/01/2017 11/28/17   Laurey Morale, MD  gabapentin (NEURONTIN) 100 MG capsule Take 1 capsule (100 mg total) by mouth 3 (three) times daily. Patient not taking: Reported on 12/01/2017 11/04/17   Laurey Morale, MD    Physical Exam: Vitals:   12/01/17 0215 12/01/17 0230 12/01/17 0245 12/01/17 0300  BP:  120/68  114/71  Pulse: (!) 55 (!) 50 60 (!) 46  Resp: 17 10 18 16   Temp:      TempSrc:      SpO2: 99% 98% 99% 97%      Constitutional: Moderately built and nourished. Vitals:   12/01/17 0215 12/01/17 0230 12/01/17 0245 12/01/17 0300  BP:  120/68  114/71  Pulse: (!) 55 (!) 50 60 (!) 46  Resp: 17 10 18 16   Temp:      TempSrc:      SpO2: 99% 98% 99% 97%   Eyes: Anicteric no pallor. ENMT: No discharge from the ears eyes nose or mouth. Neck: No mass felt.  No neck rigidity.  No JVD appreciated. Respiratory: No rhonchi or crepitations. Cardiovascular: S1-S2 heard no murmurs  appreciated. Abdomen: Soft nontender bowel sounds present. Musculoskeletal: No edema.  No joint effusion. Skin: No rash. Neurologic: Alert awake oriented to time place and person.  Moves all extremities. Psychiatric: Appears normal per normal affect.   Labs on Admission: I have personally reviewed following labs and imaging studies  CBC: Recent Labs  Lab 11/30/17 2310  WBC 3.9*  HGB 13.7  HCT 42.3  MCV 91.8  PLT 166*   Basic Metabolic Panel: Recent Labs  Lab 11/30/17 2310  NA 138  K 4.1  CL 105  CO2 25  GLUCOSE 106*  BUN 17  CREATININE 1.16  CALCIUM 8.6*   GFR: CrCl cannot be calculated (Unknown ideal weight.). Liver Function Tests: No results for  input(s): AST, ALT, ALKPHOS, BILITOT, PROT, ALBUMIN in the last 168 hours. No results for input(s): LIPASE, AMYLASE in the last 168 hours. No results for input(s): AMMONIA in the last 168 hours. Coagulation Profile: No results for input(s): INR, PROTIME in the last 168 hours. Cardiac Enzymes: No results for input(s): CKTOTAL, CKMB, CKMBINDEX, TROPONINI in the last 168 hours. BNP (last 3 results) No results for input(s): PROBNP in the last 8760 hours. HbA1C: No results for input(s): HGBA1C in the last 72 hours. CBG: No results for input(s): GLUCAP in the last 168 hours. Lipid Profile: No results for input(s): CHOL, HDL, LDLCALC, TRIG, CHOLHDL, LDLDIRECT in the last 72 hours. Thyroid Function Tests: No results for input(s): TSH, T4TOTAL, FREET4, T3FREE, THYROIDAB in the last 72 hours. Anemia Panel: No results for input(s): VITAMINB12, FOLATE, FERRITIN, TIBC, IRON, RETICCTPCT in the last 72 hours. Urine analysis:    Component Value Date/Time   BILIRUBINUR Negative 11/07/2017 1221   PROTEINUR Positive (A) 11/07/2017 1221   UROBILINOGEN 0.2 11/07/2017 1221   NITRITE Negative 11/07/2017 1221   LEUKOCYTESUR Negative 11/07/2017 1221   Sepsis Labs: @LABRCNTIP (procalcitonin:4,lacticidven:4) )No results found for this or any previous visit (from the past 240 hour(s)).   Radiological Exams on Admission: Dg Chest 2 View  Result Date: 11/30/2017 CLINICAL DATA:  Chest pressure and dizziness EXAM: CHEST - 2 VIEW COMPARISON:  09/02/2013 FINDINGS: The heart size and mediastinal contours are within normal limits. Both lungs are clear. Mild degenerative changes of the spine. IMPRESSION: No active cardiopulmonary disease. Electronically Signed   By: Donavan Foil M.D.   On: 11/30/2017 23:44    EKG: Independently reviewed.  Sinus bradycardia rate around 53 bpm.  Assessment/Plan Principal Problem:   Chest pain Active Problems:   Hyperlipidemia   Thrombocytopenia ([place])    1. Chest pain -given  the risk factors including morbid obesity hyperlipidemia and family history we will cycle cardiac markers to rule out ACS.  I have notified cardiology.  We will keep patient on aspirin.  Trend cardiac markers check d-dimer.  LFTs and drug screen are pending. 2. Dizziness near syncope -EKG shows sinus bradycardia closely observe.  Check TSH.  Follow cardiac markers. 3. History of hyperlipidemia was prescribed Lipitor by primary care but has not been taking. 4. History of neuropathy of lower extremities being worked up by primary care. 5. Thrombocytopenia appears to be chronic.   DVT prophylaxis: Lovenox. Code Status: Full code. Family Communication: Discussed with patient. Disposition Plan: Home. Consults called: Cardiology. Admission status: Observation.   Rise Patience MD [facility] Pager [phone].  If 7PM-7AM, please contact night-coverage [email or web address] Password [facility]  12/01/2017, 4:24 AM

## 2017-12-01 NOTE — Progress Notes (Signed)
   12/01/17 0637  Vitals  Temp 98 F (36.7 C)  Temp Source Oral  BP 118/83  BP Method Automatic  Patient Position (if appropriate) Sitting  Pulse Rate (!) 47  Pulse Rate Source Dinamap  Resp 17  Oxygen Therapy  SpO2 98 %  O2 Device Room Air  Height and Weight  Height 6\' 2"  (1.88 m)  Weight 135.3 kg (Scale C)  Type of Scale Used Standing  Type of Weight Actual  BSA (Calculated - sq m) 2.66 sq meters  BMI (Calculated) 38.27  Weight in (lb) to have BMI = 25 194.3  Admitted pt to rm 3E15 from ED, oriented to room, call bell placed within reach, pt denied chest pain, placed on cardiac monitor, CCMD made aware.

## 2017-12-01 NOTE — ED Provider Notes (Signed)
[facility] EMERGENCY DEPARTMENT Provider Note   CSN: [id number] Arrival date & time: 11/30/17  2302     History   Chief Complaint Chief Complaint  Patient presents with  . Chest Pain  . Dizziness    HPI Reginald Simmons is a 47 y.o. male.  Patient with a history of HLD presents for evaluation of chest pain that started around 9:30 pm (11/30/17) while at home cooking. Symptoms started with a cold sweat and lightheadedness, followed by central chest tightness. He felt some slight pain behind his right ear. Symptoms lasted about 2 hours then subsided. No history of similar symptoms. He denies any new medications, but does report starting a Keto diet 4 weeks ago with a resulting 20 pound weight loss during that time. He has started taking supplements including B12 and an herb found in green tea. He also reports going back to the gym since his diet began and goes 3-4 days weekly. No SOB, nausea, vomiting. No recent illness including no fever, congestion, cough, urinary symptoms or diarrhea/constipation.   The history is provided by the patient. No language interpreter was used.  Chest Pain   Associated symptoms include diaphoresis and dizziness. Pertinent negatives include no abdominal pain, no fever, no nausea, no shortness of breath and no vomiting.  Dizziness  Associated symptoms: chest pain   Associated symptoms: no nausea, no shortness of breath and no vomiting     Past Medical History:  Diagnosis Date  . Allergy   . GERD (gastroesophageal reflux disease)   . Hyperlipidemia   . Onychomycosis     Patient Active Problem List   Diagnosis Date Noted  . Neck strain 06/22/2015  . Hyperlipidemia 05/24/2008  . KELOID SCAR 05/24/2008  . HIDRADENITIS SUPPURATIVA 05/24/2008  . HOARSENESS 05/24/2008  . ALLERGIC RHINITIS 12/19/2006  . GERD 11/04/2006    History reviewed. No pertinent surgical history.      Home Medications    Prior to Admission medications    Medication Sig Start Date End Date Taking? Authorizing Provider  beclomethasone (QVAR REDIHALER) 80 MCG/ACT inhaler Inhale 2 puffs into the lungs 2 (two) times daily. Patient taking differently: Inhale 2 puffs into the lungs 2 (two) times daily as needed (SOB).  11/01/16  Yes Laurey Morale, MD  cetirizine (ZYRTEC) 10 MG tablet Take 10 mg by mouth daily as needed for allergies.    Yes [provider]  Cyanocobalamin (VITAMIN B-12 PO) Take 1-3 tablets by mouth daily.   Yes [provider]  meloxicam (MOBIC) 15 MG tablet Take 1 tablet (15 mg total) by mouth daily. 11/01/16  Yes Laurey Morale, MD  atorvastatin (LIPITOR) 40 MG tablet Take 1 tablet (40 mg total) by mouth daily. Patient not taking: Reported on 12/01/2017 11/28/17   Laurey Morale, MD  gabapentin (NEURONTIN) 100 MG capsule Take 1 capsule (100 mg total) by mouth 3 (three) times daily. Patient not taking: Reported on 12/01/2017 11/04/17   Laurey Morale, MD    Family History Family History  Problem Relation Age of Onset  . Hypertension Unknown   . Stroke Unknown   . Heart disease Unknown     Social History Social History   Tobacco Use  . Smoking status: Former Research scientist (life sciences)  . Smokeless tobacco: Never Used  Substance Use Topics  . Alcohol use: No    Alcohol/week: 0.0 standard drinks  . Drug use: No     Allergies   Patient has no known allergies.  Review of Systems Review of Systems  Constitutional: Positive for activity change and diaphoresis. Negative for chills and fever.  HENT: Negative.   Respiratory: Negative.  Negative for shortness of breath.   Cardiovascular: Positive for chest pain. Negative for leg swelling.  Gastrointestinal: Negative.  Negative for abdominal pain, nausea and vomiting.  Genitourinary: Negative.   Musculoskeletal: Negative.   Skin: Negative.   Neurological: Positive for dizziness. Negative for syncope.     Physical Exam Updated Vital Signs BP 125/71   Pulse (!) 51    Temp 99.1 F (37.3 C) (Oral)   Resp 13   SpO2 97%   Physical Exam  Constitutional: He is oriented to person, place, and time. He appears well-developed and well-nourished.  HENT:  Head: Normocephalic.  Neck: Normal range of motion. Neck supple.  Cardiovascular: Normal rate and regular rhythm.  No murmur heard. Pulmonary/Chest: Effort normal and breath sounds normal. He has no wheezes. He has no rales.  Abdominal: Soft. Bowel sounds are normal. There is no tenderness. There is no rebound and no guarding.  Musculoskeletal: Normal range of motion.       Right lower leg: Normal. He exhibits no edema.       Left lower leg: Normal. He exhibits no edema.  Neurological: He is alert and oriented to person, place, and time.  Skin: Skin is warm and dry. No rash noted.  Psychiatric: He has a normal mood and affect.     ED Treatments / Results  Labs (all labs ordered are listed, but only abnormal results are displayed) Labs Reviewed  BASIC METABOLIC PANEL - Abnormal; Notable for the following components:      Result Value   Glucose, Bld 106 (*)    Calcium 8.6 (*)    All other components within normal limits  CBC - Abnormal; Notable for the following components:   WBC 3.9 (*)    Platelets 108 (*)    All other components within normal limits  I-STAT TROPONIN, ED  I-STAT TROPONIN, ED   Results for orders placed or performed during the hospital encounter of 60/10/93  Basic metabolic panel  Result Value Ref Range   Sodium 138 135 - 145 mmol/L   Potassium 4.1 3.5 - 5.1 mmol/L   Chloride 105 98 - 111 mmol/L   CO2 25 22 - 32 mmol/L   Glucose, Bld 106 (H) 70 - 99 mg/dL   BUN 17 6 - 20 mg/dL   Creatinine, Ser 1.16 0.61 - 1.24 mg/dL   Calcium 8.6 (L) 8.9 - 10.3 mg/dL   GFR calc non Af Amer >60 >60 mL/min   GFR calc Af Amer >60 >60 mL/min   Anion gap 8 5 - 15  CBC  Result Value Ref Range   WBC 3.9 (L) 4.0 - 10.5 K/uL   RBC 4.61 4.22 - 5.81 MIL/uL   Hemoglobin 13.7 13.0 - 17.0 g/dL    HCT 42.3 39.0 - 52.0 %   MCV 91.8 78.0 - 100.0 fL   MCH 29.7 26.0 - 34.0 pg   MCHC 32.4 30.0 - 36.0 g/dL   RDW 14.6 11.5 - 15.5 %   Platelets 108 (L) 150 - 400 K/uL  I-stat troponin, ED  Result Value Ref Range   Troponin i, poc 0.00 0.00 - 0.08 ng/mL   Comment 3          I-Stat Troponin, ED (not at [facility])  Result Value Ref Range   Troponin i, poc 0.00 0.00 - 0.08 ng/mL  Comment 3             EKG EKG Interpretation  Date/Time:  Sunday November 30 2017 22:59:32 EDT Ventricular Rate:  58 PR Interval:  206 QRS Duration: 90 QT Interval:  408 QTC Calculation: 400 R Axis:   36 Text Interpretation:  Sinus bradycardia Otherwise normal ECG No significant change since last tracing Confirmed by Pryor Curia [phone]) on 12/01/2017 2:44:49 AM   Radiology Dg Chest 2 View  Result Date: 11/30/2017 CLINICAL DATA:  Chest pressure and dizziness EXAM: CHEST - 2 VIEW COMPARISON:  09/02/2013 FINDINGS: The heart size and mediastinal contours are within normal limits. Both lungs are clear. Mild degenerative changes of the spine. IMPRESSION: No active cardiopulmonary disease. Electronically Signed   By: Donavan Foil M.D.   On: 11/30/2017 23:44    Procedures Procedures (including critical care time)  Medications Ordered in ED Medications - No data to display   Initial Impression / Assessment and Plan / ED Course  I have reviewed the triage vital signs and the nursing notes.  Pertinent labs & imaging results that were available during my care of the patient were reviewed by me and considered in my medical decision making (see chart for details).     Patient here with episode diaphoresis, lightheadedness without syncope, chest tightness, lasting 2 hours, starting last evening. No history of similar symptoms.   He is currently asymptomatic. Labs are reassuring, including troponin x 2. He is not orthostatic. EKG without acute changes. Symptoms atypical for ACS, with heart Score 3. Diet may be  contributory with rapid weight loss, however, electrolytes are in line and he does not appear dehydrated.   Discussed plan of care with the patient, specifically, discharge home vs further inpatient work up for concerning symptoms. Patient prefers to stay in the hospital and is not comfortable with discharge home. Will discuss with the hospitalist.  Discussed with Dr. Hal Hope who will see the patient to evaluate for admission.   Final Clinical Impressions(s) / ED Diagnoses   Final diagnoses:  None   1. Chest pain  ED Discharge Orders    None       Charlann Lange, PA-C 12/01/17 [place], Delice Bison, DO 12/01/17 [phone]

## 2017-12-01 NOTE — Plan of Care (Signed)
Nutrition Education Note  RD consulted for nutrition education regarding a Heart Healthy diet.   RD provided "Heart Healthy Nutrition Therapy" handout from the Academy of [employer]. Reviewed patient's dietary recall. Provided examples on ways to decrease sodium and fat intake in diet. Discouraged intake of processed foods and use of salt shaker. Encouraged fresh fruits and vegetables as well as whole grain sources of carbohydrates to maximize fiber intake. Provided examples of ways to balance meals/snacks and encouraged intake of high-fiber, whole grain complex carbohydrates.   Pt recently started a keto diet two months ago. His typical food items consist of chicken, [place], pickles, lamb, salmon, broccoli, kale, cabbage, eggs, tuna, cashews, and sunflower seeds. He increased his physical activity by working out on the elliptical for 30 minutes 4 times per week. He has lost 20 lb within a 2 month time frame. Encouraged pt to continue consuming lean meats, discouraged high fat meal consumption, and discussed way to increase fiber intake.   Teach back method used.Expect good compliance.  Body mass index is 38.29 kg/m.   Current diet order is heart healthy Labs and medications reviewed. No further nutrition interventions warranted at this time. RD contact information provided. If additional nutrition issues arise, please re-consult RD.  Mariana Single RD, LDN Clinical Nutrition Pager # [phone]

## 2017-12-01 NOTE — Progress Notes (Signed)
Pt has orders to be discharged. Discharge instructions given and pt has no additional questions at this time. Medication regimen reviewed and pt educated. Pt verbalized understanding and has no additional questions. Telemetry box removed. IV removed and site in good condition. Dietician spoke with patient. Pt stable and waiting for transportation.

## 2017-12-01 NOTE — ED Notes (Signed)
Pt reports chest and dizziness that started about 2200 hrs. Pt reports he was getting ready for bed when this all happened.

## 2017-12-01 NOTE — Consult Note (Addendum)
Cardiology Consultation:   Patient ID: Reginald Simmons; [id number]; October 01, 1970   Admit date: 11/30/2017 Date of Consult: 12/01/2017  Primary Care Provider: Laurey Morale, MD Primary Cardiologist: new to [facility]  Primary Electrophysiologist:  None   Patient Profile:   Reginald Simmons is a 47 y.o. male with a PMH of HLD, peripheral neuropathy, and morbid obesity, who is being seen today for the evaluation of chest pain at the request of Dr. Hal Hope.  History of Present Illness:   Reginald Simmons was in his usual state of health until the evening of 11/30/17 when he was getting ready for bed and became acutely diaphoretic and dizzy with subsequent development of chest pressure. He denied associated palpitations, SOB, nausea, vomiting, or syncope. He has never experience chest pain in the past. Given persistent symptoms he activated EMS and was given ASA en route to the ER.   He has no prior cardiac history. He has never had an ischemic evaluation. His risk factors for heart disease include HLD, morbid obesity, and family history of MI in his mother at age 36.   At the time of this evaluation he is chest pain free. He states his symptoms lasted ~1-2 hour last night. 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 He has been exercising 3-4 days per week for the past month. He reports starting the keto diet about 2 weeks ago and has lost 20lbs since that time. Only new medications at this time include B12 which he's taking for peripheral neuropathy. He denies orthopnea, PND, or LE edema.   Hospital course: Bradycardic to the 40s-50s, otherwise VSS. Labs notable for electrolytes wnl, Cr 1.16, CBC unremarkable with the exception of PLT 116 (chronic), Trop negative x2, TSH wnl. EKG with sinus bradycardia, early repolarization, no STE/D, no TWI. CXR without acute findings. Patient was admitted to medicine. Cardiology asked to evaluate for chest pain.   Past Medical History:  Diagnosis Date  .  Allergy   . GERD (gastroesophageal reflux disease)   . Hyperlipidemia   . Onychomycosis     History reviewed. No pertinent surgical history.   Home Medications:  Prior to Admission medications   Medication Sig Start Date End Date Taking? Authorizing Provider  beclomethasone (QVAR REDIHALER) 80 MCG/ACT inhaler Inhale 2 puffs into the lungs 2 (two) times daily. Patient taking differently: Inhale 2 puffs into the lungs 2 (two) times daily as needed (SOB).  11/01/16  Yes Laurey Morale, MD  cetirizine (ZYRTEC) 10 MG tablet Take 10 mg by mouth daily as needed for allergies.    Yes [provider]  Cyanocobalamin (VITAMIN B-12 PO) Take 1-3 tablets by mouth daily.   Yes [provider]  meloxicam (MOBIC) 15 MG tablet Take 1 tablet (15 mg total) by mouth daily. 11/01/16  Yes Laurey Morale, MD  atorvastatin (LIPITOR) 40 MG tablet Take 1 tablet (40 mg total) by mouth daily. Patient not taking: Reported on 12/01/2017 11/28/17   Laurey Morale, MD  gabapentin (NEURONTIN) 100 MG capsule Take 1 capsule (100 mg total) by mouth 3 (three) times daily. Patient not taking: Reported on 12/01/2017 11/04/17   Laurey Morale, MD    Inpatient Medications: Scheduled Meds: . enoxaparin (LOVENOX) injection  40 mg Subcutaneous Q24H   Continuous Infusions:  PRN Meds: acetaminophen, ondansetron (ZOFRAN) IV  Allergies:   No Known Allergies  Social History:   Social History   Socioeconomic History  . Marital status: Divorced  Spouse name: Not on file  . Number of children: Not on file  . Years of education: Not on file  . Highest education level: Not on file  Occupational History  . Not on file  Social Needs  . Financial resource strain: Not on file  . Food insecurity:    Worry: Not on file    Inability: Not on file  . Transportation needs:    Medical: Not on file    Non-medical: Not on file  Tobacco Use  . Smoking status: Former Research scientist (life sciences)  . Smokeless tobacco: Never Used  Substance  and Sexual Activity  . Alcohol use: No    Alcohol/week: 0.0 standard drinks  . Drug use: No  . Sexual activity: Not on file  Lifestyle  . Physical activity:    Days per week: Not on file    Minutes per session: Not on file  . Stress: Not on file  Relationships  . Social connections:    Talks on phone: Not on file    Gets together: Not on file    Attends religious service: Not on file    Active member of club or organization: Not on file    Attends meetings of clubs or organizations: Not on file    Relationship status: Not on file  . Intimate partner violence:    Fear of current or ex partner: Not on file    Emotionally abused: Not on file    Physically abused: Not on file    Forced sexual activity: Not on file  Other Topics Concern  . Not on file  Social History Narrative  . Not on file    Family History:   Mother suffered MI at age 21. Father died from stroke in his 30s. Family History  Problem Relation Age of Onset  . Hypertension Unknown   . Stroke Unknown   . Heart disease Unknown      ROS:  Please see the history of present illness.   All other ROS reviewed and negative.     Physical Exam/Data:   Vitals:   12/01/17 0400 12/01/17 0415 12/01/17 0430 12/01/17 0637  BP: 116/76  114/78 118/83  Pulse: (!) 48 60 (!) 49 (!) 47  Resp: 19 14 11 17   Temp:    98 F (36.7 C)  TempSrc:    Oral  SpO2: 99% 96% 99% 98%  Height:    6' (1.829 m)   No intake or output data in the 24 hours ending 12/01/17 0640 There were no vitals filed for this visit. Body mass index is 41.39 kg/m.  General:  Well nourished, well developed, morbidly obese gentleman laying in no acute distress HEENT: sclera anicteric  Neck: no JVD Vascular: No carotid bruits; distal pulses 2+ bilaterally Cardiac:  normal S1, S2; bradycardic, regular rhythm; no murmurs, rubs, or gallops Lungs:  clear to auscultation bilaterally, no wheezing, rhonchi or rales  Abd: NABS, soft, obese, nontender, no  hepatomegaly Ext: no edema Musculoskeletal:  No deformities, BUE and BLE strength normal and equal Skin: warm and dry  Neuro:  CNs 2-12 intact, no focal abnormalities noted Psych:  Normal affect   EKG:  The EKG was personally reviewed and demonstrates:  Sinus bradycardia, rate 58, QTC 400, early repolarization changes, no STE/D, no TWI.  Telemetry:  Telemetry was personally reviewed and demonstrates:  Sinus bradycardia with rates 40s-50s. No arrhythmias  Relevant CV Studies: None  Laboratory Data:  Chemistry Recent Labs  Lab 11/30/17 2310 12/01/17 0434  NA 138  --   K 4.1  --   CL 105  --   CO2 25  --   GLUCOSE 106*  --   BUN 17  --   CREATININE 1.16 1.04  CALCIUM 8.6*  --   GFRNONAA >60 >60  GFRAA >60 >60  ANIONGAP 8  --     Recent Labs  Lab 12/01/17 0434  PROT 6.6  ALBUMIN 3.5  AST 27  ALT 37  ALKPHOS 45  BILITOT 0.7   Hematology Recent Labs  Lab 11/30/17 2310 12/01/17 0434  WBC 3.9* 4.5  RBC 4.61 4.59  HGB 13.7 13.5  HCT 42.3 42.3  MCV 91.8 92.2  MCH 29.7 29.4  MCHC 32.4 31.9  RDW 14.6 14.3  PLT 108* 116*   Cardiac Enzymes Recent Labs  Lab 12/01/17 0434  TROPONINI <0.03    Recent Labs  Lab 11/30/17 2320 12/01/17 0232  TROPIPOC 0.00 0.00    BNPNo results for input(s): BNP, PROBNP in the last 168 hours.  DDimer No results for input(s): DDIMER in the last 168 hours.  Radiology/Studies:  Dg Chest 2 View  Result Date: 11/30/2017 CLINICAL DATA:  Chest pressure and dizziness EXAM: CHEST - 2 VIEW COMPARISON:  09/02/2013 FINDINGS: The heart size and mediastinal contours are within normal limits. Both lungs are clear. Mild degenerative changes of the spine. IMPRESSION: No active cardiopulmonary disease. Electronically Signed   By: Donavan Foil M.D.   On: 11/30/2017 23:44    Assessment and Plan:   1. Chest pain: patient was getting ready for bed when he became diaphoretic and dizzy with subsequent chest pressure. No prior cardiac history. No  prior ischemic work up. EKG without ischemic changes. Troponins negative. Risk factors for heart disease include HLD, morbid obesity, and mother with MI at age 30.  - Will plan for an exercise treadmill stress test - if normal, do not anticipate further cardiac work-up.  - Encouraged compliance with atorvastatin  2. HLD: LDL 162 11/07/17. Not on statin therapy prior to admission. Recently prescribed atorvastatin 40mg  daily by PCP but has not started taking this.  - Will start atorvastatin 40mg  daily - Encourage dietary and lifestyle modifications - will consult nutrition for assistance  3. Morbid obesity: Wants to lose weight. Recently started the keto diet.  - Continue to encourage healthy dietary and lifestyle modifications.     For questions or updates, please contact [place] Please consult [email or web address] for contact info under Cardiology/STEMI.   Signed, Abigail Butts, PA-C  12/01/2017 6:40 AM [phone]  Patient examined chart reviewed Self limited episode of atypical chest pain dyspnea and dizziness. Telemetry normal , ECG normal r/o Exam with obese black male clear lungs normal heart sounds abdomen soft no edema. He is active works as a Building control surveyor. Given normal ECG will start risk stratification with exercise treadmill test Have ordered today if normal may be d/c with outpatient f/u with primary   Jenkins Rouge

## 2017-12-01 NOTE — Discharge Summary (Signed)
Physician Discharge Summary  Reginald Simmons:[id number] DOB: 10-Feb-1971 DOA: 11/30/2017  PCP: Laurey Morale, MD  Admit date: 11/30/2017 Discharge date: 12/01/2017  Admitted From: Home Disposition: Home  Recommendations for Outpatient Follow-up:  1. Follow up with PCP in 2 weeks   Home Health: None Equipment/Devices: None  Discharge Condition fair CODE STATUS: Full code Diet recommendation: Regular    Discharge Diagnoses:  Principal Problem:   Chest pain, atypical  Active Problems:   Hyperlipidemia   Thrombocytopenia (HCC)   Obesity (BMI 30-39.9)  Brief narrative/HPI Please refer to admission H&P for details, in brief, 47 year old obese male with history of hyperlipidemia, recently diagnosed with lower extremity neuropathy presented with diaphoresis, dizziness and chest pressure without associated shortness of breath or palpitations. In the ED vitals were stable.  EKG showed sinus bradycardia.  Initial troponin was negative.  Having risk factor of hyperlipidemia, morbid obesity and mother having MI at age 38 patient was placed in observation for ACS rule out.  Hospital course Chest pain, atypical Suspect musculoskeletal.  Serial troponins were negative.  Stable on telemetry except for heart rate dropping down to high 40s-50s while asleep. Seen by cardiology and patient underwent exercise treadmill stress test which was negative for ischemia and showed hypertensive response. Patient remains chest pain-free and can be discharged home.  Instructed on being adherent to his statin.  Hyperlipidemia Continue statin.  Obesity, BMI of 38.29 kg/m Counseled on diet and exercise to lose weight.  Nutrition consult prior to discharge.   Peripheral neuropathy Continue gabapentin  Consults: Cardiology  Disposition: Home Family communication: None at bedside Procedure: Exercise treadmill stress test    Discharge Instructions   Allergies as of 12/01/2017   No Known  Allergies     Medication List    TAKE these medications   atorvastatin 40 MG tablet Commonly known as:  LIPITOR Take 1 tablet (40 mg total) by mouth daily.   beclomethasone 80 MCG/ACT inhaler Commonly known as:  QVAR Inhale 2 puffs into the lungs 2 (two) times daily. What changed:    when to take this  reasons to take this   cetirizine 10 MG tablet Commonly known as:  ZYRTEC Take 10 mg by mouth daily as needed for allergies.   gabapentin 100 MG capsule Commonly known as:  NEURONTIN Take 1 capsule (100 mg total) by mouth 3 (three) times daily.   meloxicam 15 MG tablet Commonly known as:  MOBIC Take 1 tablet (15 mg total) by mouth daily.   VITAMIN B-12 PO Take 1-3 tablets by mouth daily.      Follow-up Information    Laurey Morale, MD. Schedule an appointment as soon as possible for a visit in 2 week(s).   Specialty:  Family Medicine Contact information: [place] [address] [phone]          No Known Allergies      Procedures/Studies: Dg Chest 2 View  Result Date: 11/30/2017 CLINICAL DATA:  Chest pressure and dizziness EXAM: CHEST - 2 VIEW COMPARISON:  09/02/2013 FINDINGS: The heart size and mediastinal contours are within normal limits. Both lungs are clear. Mild degenerative changes of the spine. IMPRESSION: No active cardiopulmonary disease. Electronically Signed   By: Donavan Foil M.D.   On: 11/30/2017 23:44     Treadmill stress test result: There was no ST segment deviation noted during stress.  No T wave inversion was noted during stress. Arrhythmias during stress: none.  Arrhythmias during recovery: occasional occasional, PACs.  Arrhythmias were not significant.  ECG was interpretable and there was no significant change from baseline.  PAC's noted only in recovery with occ non-conducted PAC. Resolved after 5 minutes  Subjective: Denies further chest pain symptoms.  Discharge Exam: Vitals:   12/01/17 0637  12/01/17 1259  BP: 118/83 113/82  Pulse: (!) 47 (!) 53  Resp: 17 20  Temp: 98 F (36.7 C) 98.4 F (36.9 C)  SpO2: 98% 97%   Vitals:   12/01/17 0415 12/01/17 0430 12/01/17 0637 12/01/17 1259  BP:  114/78 118/83 113/82  Pulse: 60 (!) 49 (!) 47 (!) 53  Resp: 14 11 17 20   Temp:   98 F (36.7 C) 98.4 F (36.9 C)  TempSrc:   Oral Oral  SpO2: 96% 99% 98% 97%  Weight:   135.3 kg   Height:   6\' 2"  (1.88 m)     General: Not in distress HEENT: Moist mucosa, supple neck Chest: Clear bilaterally CVS: Normal S1 and S2, no murmurs GI: Soft, nondistended, nontender Musculoskeletal: Warm, no edema    The results of significant diagnostics from this hospitalization (including imaging, microbiology, ancillary and laboratory) are listed below for reference.     Microbiology: No results found for this or any previous visit (from the past 240 hour(s)).   Labs: BNP (last 3 results) No results for input(s): BNP in the last 8760 hours. Basic Metabolic Panel: Recent Labs  Lab 11/30/17 2310 12/01/17 0434  NA 138  --   K 4.1  --   CL 105  --   CO2 25  --   GLUCOSE 106*  --   BUN 17  --   CREATININE 1.16 1.04  CALCIUM 8.6*  --    Liver Function Tests: Recent Labs  Lab 12/01/17 0434  AST 27  ALT 37  ALKPHOS 45  BILITOT 0.7  PROT 6.6  ALBUMIN 3.5   Recent Labs  Lab 12/01/17 0434  LIPASE 29   No results for input(s): AMMONIA in the last 168 hours. CBC: Recent Labs  Lab 11/30/17 2310 12/01/17 0434  WBC 3.9* 4.5  HGB 13.7 13.5  HCT 42.3 42.3  MCV 91.8 92.2  PLT 108* 116*   Cardiac Enzymes: Recent Labs  Lab 12/01/17 0434  TROPONINI <0.03   BNP: Invalid input(s): POCBNP CBG: No results for input(s): GLUCAP in the last 168 hours. D-Dimer Recent Labs    12/01/17 0658  DDIMER 0.98*   Hgb A1c No results for input(s): HGBA1C in the last 72 hours. Lipid Profile No results for input(s): CHOL, HDL, LDLCALC, TRIG, CHOLHDL, LDLDIRECT in the last 72  hours. Thyroid function studies Recent Labs    12/01/17 0434  TSH 3.645   Anemia work up No results for input(s): VITAMINB12, FOLATE, FERRITIN, TIBC, IRON, RETICCTPCT in the last 72 hours. Urinalysis    Component Value Date/Time   BILIRUBINUR Negative 11/07/2017 1221   PROTEINUR Positive (A) 11/07/2017 1221   UROBILINOGEN 0.2 11/07/2017 1221   NITRITE Negative 11/07/2017 1221   LEUKOCYTESUR Negative 11/07/2017 1221   Sepsis Labs Invalid input(s): PROCALCITONIN,  WBC,  LACTICIDVEN Microbiology No results found for this or any previous visit (from the past 240 hour(s)).   Time coordinating discharge:<30 minutes  SIGNED:   Louellen Molder, MD  [facility] 12/01/2017, 1:10 PM Pager   If 7PM-7AM, please contact night-coverage [email or web address] Password [email or web address]

## 2017-12-02 ENCOUNTER — Telehealth: Payer: Self-pay | Admitting: Family Medicine

## 2017-12-02 NOTE — Telephone Encounter (Signed)
Unable to reach patient at time of TCM Call. Left message for patient to return call when available.  

## 2017-12-04 NOTE — Addendum Note (Signed)
Addended by: Miles Costain T on: 12/04/2017 11:02 AM   Modules accepted: Orders

## 2017-12-04 NOTE — Telephone Encounter (Signed)
Transition Care Management Follow-up Telephone Call   Date discharged? 12/01/17   How have you been since you were released from the hospital? Pt states he is doing well.  Had many tests while in the hospital and they came back normal.   Do you understand why you were in the hospital? yes   Do you understand the discharge instructions? yes   Where were you discharged to? Home   Items Reviewed:  Medications reviewed: yes  Allergies reviewed: yes  Dietary changes reviewed: yes  Referrals reviewed: yes   Functional Questionnaire:   Activities of Daily Living (ADLs):   He states they are independent in the following: ambulation, bathing and hygiene, feeding, continence, grooming, toileting and dressing States they require assistance with the following: No assistance needed   Any transportation issues/concerns?: no   Any patient concerns? Yes.  Pt would like to know if it is safe to take all of his medications at the same time.   Confirmed importance and date/time of follow-up visits scheduled yes Provider Appointment booked with Dr. Sarajane Jews  Confirmed with patient if condition begins to worsen call PCP or go to the ER.  Patient was given the office number and encouraged to call back with question or concerns.  : yes

## 2017-12-05 ENCOUNTER — Ambulatory Visit (INDEPENDENT_AMBULATORY_CARE_PROVIDER_SITE_OTHER): Payer: 59 | Admitting: Family Medicine

## 2017-12-05 ENCOUNTER — Encounter: Payer: Self-pay | Admitting: Family Medicine

## 2017-12-05 VITALS — BP 118/78 | HR 56 | Temp 98.6°F | Wt 299.2 lb

## 2017-12-05 DIAGNOSIS — E669 Obesity, unspecified: Secondary | ICD-10-CM

## 2017-12-05 DIAGNOSIS — R42 Dizziness and giddiness: Secondary | ICD-10-CM | POA: Diagnosis not present

## 2017-12-05 DIAGNOSIS — R5383 Other fatigue: Secondary | ICD-10-CM

## 2017-12-05 NOTE — Progress Notes (Signed)
   Subjective:    Patient ID: Reginald Simmons, male    DOB: 1971-02-17, 47 y.o.   MRN: [id number]  HPI Here for follow up a hospital stay from 11-30-17 to 12-01-17 for some lightheadedness, a mild headache, feeling clammy, and some SOB. No chest pressure or pain. His exam and labs were normal, as was a treadmill stress test. No explanation was found for his symptoms, and he has felt fine since going home. Of note he recently started on a strict ketogenic diet to lose weight.    Review of Systems  Constitutional: Negative.   Respiratory: Negative.   Cardiovascular: Negative.   Gastrointestinal: Negative.   Genitourinary: Negative.   Neurological: Negative.        Objective:   Physical Exam  Constitutional: He is oriented to person, place, and time. He appears well-developed and well-nourished.  Neck: No thyromegaly present.  Cardiovascular: Normal rate, regular rhythm, normal heart sounds and intact distal pulses.  Pulmonary/Chest: Effort normal and breath sounds normal. No stridor. No respiratory distress. He has no wheezes. He has no rales.  Lymphadenopathy:    He has no cervical adenopathy.  Neurological: He is alert and oriented to person, place, and time.          Assessment & Plan:  He seems fine today. I suspect his earlier symptoms were related to the sudden and major changes he made in his diet. I suggested he modify the diet a little and said he has already done so. Recheck prn.  Alysia Penna, MD

## 2017-12-15 DIAGNOSIS — I4430 Unspecified atrioventricular block: Secondary | ICD-10-CM | POA: Diagnosis not present

## 2017-12-15 DIAGNOSIS — I440 Atrioventricular block, first degree: Secondary | ICD-10-CM | POA: Diagnosis not present

## 2017-12-15 DIAGNOSIS — I491 Atrial premature depolarization: Secondary | ICD-10-CM | POA: Diagnosis not present

## 2018-04-21 ENCOUNTER — Other Ambulatory Visit: Payer: Self-pay | Admitting: Family Medicine

## 2018-04-21 MED ORDER — BECLOMETHASONE DIPROP HFA 80 MCG/ACT IN AERB: 2.0000 | INHALATION_SPRAY | Freq: Two times a day (BID) | RESPIRATORY_TRACT | 3 refills | Status: DC

## 2018-04-21 NOTE — Telephone Encounter (Signed)
Copied from [place] [id number]. Topic: Quick Communication - Rx Refill/Question >> Apr 21, 2018  6:14 PM Windy Kalata wrote: Medication: beclomethasone (QVAR REDIHALER) 80 MCG/ACT inhaler  Has the patient contacted their pharmacy? Yes.   (Agent: If no, request that the patient contact the pharmacy for the refill.) (Agent: If yes, when and what did the pharmacy advise?) Call office  Preferred Pharmacy (with phone number or street name): [place], [place] - [address] [phone] (Phone) [phone] (Fax)    Agent: Please be advised that RX refills may take up to 3 business days. We ask that you follow-up with your pharmacy.

## 2018-04-22 DIAGNOSIS — J111 Influenza due to unidentified influenza virus with other respiratory manifestations: Secondary | ICD-10-CM | POA: Diagnosis not present

## 2018-05-22 ENCOUNTER — Ambulatory Visit: Payer: 59 | Admitting: Family Medicine

## 2018-08-07 ENCOUNTER — Encounter: Payer: Self-pay | Admitting: Family Medicine

## 2018-08-07 ENCOUNTER — Ambulatory Visit (INDEPENDENT_AMBULATORY_CARE_PROVIDER_SITE_OTHER): Payer: 59 | Admitting: Family Medicine

## 2018-08-07 ENCOUNTER — Other Ambulatory Visit: Payer: Self-pay

## 2018-08-07 DIAGNOSIS — E782 Mixed hyperlipidemia: Secondary | ICD-10-CM

## 2018-08-07 NOTE — Progress Notes (Signed)
   Subjective:    Patient ID: Reginald Simmons, male    DOB: [birth year], 48 y.o.   MRN: [id number]  HPI Virtual Visit via Telephone Note  I connected with the patient on 08/07/18 at  9:30 AM EDT by telephone and verified that I am speaking with the correct person using two identifiers. We attempted to connect virtually but we had technical difficulties with the audio and video.     I discussed the limitations, risks, security and privacy concerns of performing an evaluation and management service by telephone and the availability of in person appointments. I also discussed with the patient that there may be a patient responsible charge related to this service. The patient expressed understanding and agreed to proceed.  Location patient: home Location provider: work or home office Participants present for the call: patient, provider Patient did not have a visit in the prior 7 days to address this/these issue(s).   History of Present Illness: Here to follow up on lipids. He feels fine. At his last well exam we increased the dose of Lipitor tp 40 mg daily. He has been watching his diet fairly well.    Observations/Objective: Patient sounds cheerful and well on the phone. I do not appreciate any SOB. Speech and thought processing are grossly intact. Patient reported vitals:  Assessment and Plan: Dyslipidemia, we will set up labs soon.  Alysia Penna, MD   Follow Up Instructions:     [phone] 5-10 [phone] 11-20 [phone] 21-30 I did not refer this patient for an OV in the next 24 hours for this/these issue(s).  I discussed the assessment and treatment plan with the patient. The patient was provided an opportunity to ask questions and all were answered. The patient agreed with the plan and demonstrated an understanding of the instructions.   The patient was advised to call back or seek an in-person evaluation if the symptoms worsen or if the condition fails to improve as anticipated.  I  provided 10 minutes of non-face-to-face time during this encounter.   Alysia Penna, MD    Review of Systems     Objective:   Physical Exam        Assessment & Plan:

## 2018-08-10 ENCOUNTER — Other Ambulatory Visit: Payer: Self-pay

## 2018-08-10 ENCOUNTER — Other Ambulatory Visit (INDEPENDENT_AMBULATORY_CARE_PROVIDER_SITE_OTHER): Payer: 59

## 2018-08-10 DIAGNOSIS — E782 Mixed hyperlipidemia: Secondary | ICD-10-CM

## 2018-08-10 LAB — LIPID PANEL
Cholesterol: 202 mg/dL — ABNORMAL HIGH (ref 0–200)
HDL: 59.9 mg/dL (ref >39.00)
LDL Cholesterol: 124 mg/dL — ABNORMAL HIGH (ref 0–99)
NonHDL: 141.64
Total CHOL/HDL Ratio: 3
Triglycerides: 89 mg/dL (ref 0.0–149.0)
VLDL: 17.8 mg/dL (ref 0.0–40.0)

## 2018-08-10 LAB — HEPATIC FUNCTION PANEL
ALT: 21 U/L (ref 0–53)
AST: 17 U/L (ref 0–37)
Albumin: 4.1 g/dL (ref 3.5–5.2)
Alkaline Phosphatase: 56 U/L (ref 39–117)
Bilirubin, Direct: 0.1 mg/dL (ref 0.0–0.3)
Total Bilirubin: 0.7 mg/dL (ref 0.2–1.2)
Total Protein: 7.1 g/dL (ref 6.0–8.3)

## 2018-08-13 ENCOUNTER — Encounter: Payer: Self-pay | Admitting: *Deleted

## 2018-08-13 ENCOUNTER — Other Ambulatory Visit: Payer: Self-pay | Admitting: Family Medicine

## 2018-08-13 ENCOUNTER — Telehealth: Payer: Self-pay | Admitting: *Deleted

## 2018-08-13 MED ORDER — ATORVASTATIN CALCIUM 80 MG PO TABS: 80.0000 mg | ORAL_TABLET | Freq: Every day | ORAL | 3 refills | Status: DC

## 2018-08-13 NOTE — Telephone Encounter (Signed)
We don't start screening colonoscopies until age 48

## 2018-08-13 NOTE — Telephone Encounter (Signed)
caled and spoke with pt about his lab results.  He is aware of these and aware to call back in 90 days to set up his lab visit.    Pt is wanting to get set up for a colonoscopy.  Dr. Sarajane Jews please advise. thanks

## 2018-08-19 NOTE — Telephone Encounter (Signed)
Called and spoke with pt and he is aware of Dr. Barbie Banner recs.  He stated that he will call and leave another message for Dr. Sarajane Jews.

## 2018-08-20 ENCOUNTER — Telehealth: Payer: Self-pay | Admitting: Family Medicine

## 2018-08-20 NOTE — Telephone Encounter (Signed)
Pt had called and wanted to set up colonoscopy---he is aware that the screening would not be done until he is 52.  Pt stated that last week he was having a BM and felt like his BM's were not passing all the way. He feels that something is not right.  Some abd pain and lower back pain.  Dr. Sarajane Jews please advise. Thanks

## 2018-08-21 NOTE — Telephone Encounter (Signed)
Set up an in person OV to assess this

## 2018-08-31 NOTE — Telephone Encounter (Signed)
I have called the pt and left a VM for him to call the office to get an appt set up to see Dr. Sarajane Jews in office.

## 2018-09-04 NOTE — Telephone Encounter (Signed)
Patient called back to set up an appt  Call back [phone]

## 2018-09-07 NOTE — Telephone Encounter (Signed)
Pt was called and scheduled for 09/10/2018 at 9:45

## 2018-09-10 ENCOUNTER — Ambulatory Visit (INDEPENDENT_AMBULATORY_CARE_PROVIDER_SITE_OTHER): Payer: 59 | Admitting: Family Medicine

## 2018-09-10 ENCOUNTER — Encounter: Payer: Self-pay | Admitting: Family Medicine

## 2018-09-10 ENCOUNTER — Other Ambulatory Visit: Payer: Self-pay

## 2018-09-10 VITALS — BP 120/82 | HR 78 | Temp 98.9°F | Wt 313.0 lb

## 2018-09-10 DIAGNOSIS — K581 Irritable bowel syndrome with constipation: Secondary | ICD-10-CM | POA: Diagnosis not present

## 2018-09-10 NOTE — Progress Notes (Signed)
   Subjective:    Patient ID: Reginald Simmons, male    DOB: December 20, 1970, 48 y.o.   MRN: [id number]  HPI Here for intermittent periods lasting several days to a week of feeling as if his bowels are not emptying completely. During these times he feels boated and his stools are little harder than usual. There is no pain or nausea or fever. The most recent bout of this started 2 weeks ago and lasted one week. Now for the past week he has felt fine. He averages 2-4 stools a day. His GERD has been well controlled.    Review of Systems  Constitutional: Negative.   Respiratory: Negative.   Cardiovascular: Negative.   Gastrointestinal: Positive for abdominal distention and constipation. Negative for abdominal pain, anal bleeding, blood in stool, diarrhea, nausea, rectal pain and vomiting.  Genitourinary: Negative.        Objective:   Physical Exam Constitutional:      General: He is not in acute distress.    Appearance: He is well-developed.  Cardiovascular:     Rate and Rhythm: Normal rate and regular rhythm.     Pulses: Normal pulses.     Heart sounds: Normal heart sounds.  Pulmonary:     Effort: Pulmonary effort is normal.     Breath sounds: Normal breath sounds.  Abdominal:     General: Abdomen is flat. Bowel sounds are normal. There is no distension.     Palpations: Abdomen is soft. There is no mass.     Tenderness: There is no abdominal tenderness. There is no guarding or rebound.     Hernia: No hernia is present.  Neurological:     Mental Status: He is alert.           Assessment & Plan:  He likely has IBS with periods of constipation. I suggested he consume plenty of fiber and water. He will also use Miralax daily. Recheck prn.  Alysia Penna, MD

## 2018-09-27 ENCOUNTER — Encounter (HOSPITAL_COMMUNITY): Payer: Self-pay | Admitting: Emergency Medicine

## 2018-09-27 ENCOUNTER — Emergency Department (HOSPITAL_COMMUNITY): Payer: 59

## 2018-09-27 ENCOUNTER — Emergency Department (HOSPITAL_COMMUNITY)
Admission: EM | Admit: 2018-09-27 | Discharge: 2018-09-27 | Disposition: A | Discharge status: Home / Self Care | Payer: 59 | Attending: Emergency Medicine | Admitting: Emergency Medicine

## 2018-09-27 ENCOUNTER — Other Ambulatory Visit: Payer: Self-pay

## 2018-09-27 DIAGNOSIS — R509 Fever, unspecified: Secondary | ICD-10-CM | POA: Diagnosis present

## 2018-09-27 DIAGNOSIS — Z79899 Other long term (current) drug therapy: Secondary | ICD-10-CM | POA: Insufficient documentation

## 2018-09-27 DIAGNOSIS — J069 Acute upper respiratory infection, unspecified: Secondary | ICD-10-CM | POA: Diagnosis not present

## 2018-09-27 DIAGNOSIS — Z87891 Personal history of nicotine dependence: Secondary | ICD-10-CM | POA: Insufficient documentation

## 2018-09-27 DIAGNOSIS — U071 COVID-19: Secondary | ICD-10-CM | POA: Insufficient documentation

## 2018-09-27 DIAGNOSIS — Z20822 Contact with and (suspected) exposure to covid-19: Secondary | ICD-10-CM

## 2018-09-27 LAB — CBC WITH DIFFERENTIAL/PLATELET
Abs Immature Granulocytes: 0.01 10*3/uL (ref 0.00–0.07)
Basophils Absolute: 0 10*3/uL (ref 0.0–0.1)
Basophils Relative: 0 %
Eosinophils Absolute: 0 10*3/uL (ref 0.0–0.5)
Eosinophils Relative: 0 %
HCT: 45.5 % (ref 39.0–52.0)
Hemoglobin: 15.1 g/dL (ref 13.0–17.0)
Immature Granulocytes: 0 %
Lymphocytes Relative: 15 %
Lymphs Abs: 0.5 10*3/uL — ABNORMAL LOW (ref 0.7–4.0)
MCH: 30.3 pg (ref 26.0–34.0)
MCHC: 33.2 g/dL (ref 30.0–36.0)
MCV: 91.4 fL (ref 80.0–100.0)
Monocytes Absolute: 0.7 10*3/uL (ref 0.1–1.0)
Monocytes Relative: 20 %
Neutro Abs: 2.1 10*3/uL (ref 1.7–7.7)
Neutrophils Relative %: 65 %
Platelets: 91 10*3/uL — ABNORMAL LOW (ref 150–400)
RBC: 4.98 MIL/uL (ref 4.22–5.81)
RDW: 14 % (ref 11.5–15.5)
WBC: 3.3 10*3/uL — ABNORMAL LOW (ref 4.0–10.5)
nRBC: 0 % (ref 0.0–0.2)

## 2018-09-27 LAB — COMPREHENSIVE METABOLIC PANEL
ALT: 46 U/L — ABNORMAL HIGH (ref 0–44)
AST: 38 U/L (ref 15–41)
Albumin: 4.1 g/dL (ref 3.5–5.0)
Alkaline Phosphatase: 57 U/L (ref 38–126)
Anion gap: 9 (ref 5–15)
BUN: 16 mg/dL (ref 6–20)
CO2: 23 mmol/L (ref 22–32)
Calcium: 8.6 mg/dL — ABNORMAL LOW (ref 8.9–10.3)
Chloride: 101 mmol/L (ref 98–111)
Creatinine, Ser: 0.97 mg/dL (ref 0.61–1.24)
GFR calc Af Amer: >60 mL/min (ref >60)
GFR calc non Af Amer: >60 mL/min (ref >60)
Glucose, Bld: 118 mg/dL — ABNORMAL HIGH (ref 70–99)
Potassium: 3.9 mmol/L (ref 3.5–5.1)
Sodium: 133 mmol/L — ABNORMAL LOW (ref 135–145)
Total Bilirubin: 0.4 mg/dL (ref 0.3–1.2)
Total Protein: 8.1 g/dL (ref 6.5–8.1)

## 2018-09-27 LAB — MAGNESIUM: Magnesium: 2.1 mg/dL (ref 1.7–2.4)

## 2018-09-27 MED ORDER — ACETAMINOPHEN 500 MG PO TABS
1000.0000 mg | ORAL_TABLET | Freq: Once | ORAL | Status: AC
  Administered 2018-09-27: 1000 mg via ORAL
  Filled 2018-09-27: qty 2

## 2018-09-27 MED ORDER — SODIUM CHLORIDE 0.9 % IV BOLUS
1000.0000 mL | Freq: Once | INTRAVENOUS | Status: AC
  Administered 2018-09-27: 1000 mL via INTRAVENOUS

## 2018-09-27 MED ORDER — KETOROLAC TROMETHAMINE 30 MG/ML IJ SOLN
15.0000 mg | Freq: Once | INTRAMUSCULAR | Status: AC
  Administered 2018-09-27: 15 mg via INTRAVENOUS
  Filled 2018-09-27: qty 1

## 2018-09-27 NOTE — ED Provider Notes (Signed)
[facility] DEPT Provider Note   CSN: [id number] Arrival date & time: 09/27/18  0005    History   Chief Complaint Chief Complaint  Patient presents with  . Fever  . Diarrhea    HPI Reginald Simmons is a 48 y.o. male.     48 yo M with a chief complaints fever and loose stools cough and congestion.  This been going on for the past couple days.  The patient recently was notified that someone at his work have been diagnosed with the novel coronavirus.  He is not sure if he was in direct contact with them or not.  Denies other suspicious contacts.  Denies suspicious food intake.  Denies dark or bloody stool.  Has been having fevers and myalgias.  Taking TheraFlu with mild improvement.  The history is provided by the patient.  Fever Associated symptoms: congestion, cough and diarrhea   Associated symptoms: no chest pain, no chills, no confusion, no headaches, no myalgias, no nausea, no rash and no vomiting   Diarrhea Associated symptoms: fever   Associated symptoms: no abdominal pain, no arthralgias, no chills, no headaches, no myalgias and no vomiting   Illness Severity:  Mild Onset quality:  Gradual Duration:  2 days Timing:  Constant Progression:  Worsening Chronicity:  New Associated symptoms: congestion, cough, diarrhea and fever   Associated symptoms: no abdominal pain, no chest pain, no headaches, no myalgias, no nausea, no rash, no shortness of breath and no vomiting     Past Medical History:  Diagnosis Date  . Allergy   . GERD (gastroesophageal reflux disease)   . Hyperlipidemia   . Onychomycosis     Patient Active Problem List   Diagnosis Date Noted  . Chest pain 12/01/2017  . Thrombocytopenia ([place]) 12/01/2017  . Obesity (BMI 30-39.9) 12/01/2017  . Neck strain 06/22/2015  . Hyperlipidemia 05/24/2008  . KELOID SCAR 05/24/2008  . HIDRADENITIS SUPPURATIVA 05/24/2008  . HOARSENESS 05/24/2008  . ALLERGIC RHINITIS 12/19/2006  .  GERD 11/04/2006    History reviewed. No pertinent surgical history.      Home Medications    Prior to Admission medications   Medication Sig Start Date End Date Taking? Authorizing Provider  atorvastatin (LIPITOR) 80 MG tablet Take 1 tablet (80 mg total) by mouth daily. 08/13/18   Laurey Morale, MD  beclomethasone (QVAR REDIHALER) 80 MCG/ACT inhaler Inhale 2 puffs into the lungs 2 (two) times daily. 04/21/18   Laurey Morale, MD  cetirizine (ZYRTEC) 10 MG tablet Take 10 mg by mouth daily as needed for allergies.     [provider]  Cyanocobalamin (VITAMIN B-12 PO) Take 1-3 tablets by mouth daily.    [provider]  meloxicam (MOBIC) 15 MG tablet Take 1 tablet (15 mg total) by mouth daily. 11/01/16   Laurey Morale, MD    Family History Family History  Problem Relation Age of Onset  . Hypertension Other   . Stroke Other   . Heart disease Other     Social History Social History   Tobacco Use  . Smoking status: Former Research scientist (life sciences)  . Smokeless tobacco: Never Used  Substance Use Topics  . Alcohol use: No    Alcohol/week: 0.0 standard drinks  . Drug use: No     Allergies   Patient has no known allergies.   Review of Systems Review of Systems  Constitutional: Positive for fever. Negative for chills.  HENT: Positive for congestion. Negative for facial swelling.  Eyes: Negative for discharge and visual disturbance.  Respiratory: Positive for cough. Negative for shortness of breath.   Cardiovascular: Negative for chest pain and palpitations.  Gastrointestinal: Positive for diarrhea. Negative for abdominal pain, nausea and vomiting.  Musculoskeletal: Negative for arthralgias and myalgias.  Skin: Negative for color change and rash.  Neurological: Negative for tremors, syncope and headaches.  Psychiatric/Behavioral: Negative for confusion and dysphoric mood.     Physical Exam Updated Vital Signs BP (!) 173/109 (BP Location: Left Arm)   Pulse (!) 105    Temp (!) 102.4 F (39.1 C) (Oral)   Resp 18   Ht 6\' 1"  (1.854 m)   Wt (!) 142.9 kg   SpO2 97%   BMI 41.56 kg/m   Physical Exam Vitals signs and nursing note reviewed.  Constitutional:      Appearance: He is well-developed.  HENT:     Head: Normocephalic and atraumatic.  Eyes:     Pupils: Pupils are equal, round, and reactive to light.  Neck:     Musculoskeletal: Normal range of motion and neck supple.     Vascular: No JVD.  Cardiovascular:     Rate and Rhythm: Normal rate and regular rhythm.     Heart sounds: No murmur. No friction rub. No gallop.   Pulmonary:     Effort: No respiratory distress.     Breath sounds: No wheezing.  Abdominal:     General: There is no distension.     Tenderness: There is no guarding or rebound.  Musculoskeletal: Normal range of motion.  Skin:    Coloration: Skin is not pale.     Findings: No rash.  Neurological:     Mental Status: He is alert and oriented to person, place, and time.  Psychiatric:        Behavior: Behavior normal.      ED Treatments / Results  Labs (all labs ordered are listed, but only abnormal results are displayed) Labs Reviewed  CBC WITH DIFFERENTIAL/PLATELET - Abnormal; Notable for the following components:      Result Value   WBC 3.3 (*)    Platelets 91 (*)    Lymphs Abs 0.5 (*)    All other components within normal limits  COMPREHENSIVE METABOLIC PANEL - Abnormal; Notable for the following components:   Sodium 133 (*)    Glucose, Bld 118 (*)    Calcium 8.6 (*)    ALT 46 (*)    All other components within normal limits  NOVEL CORONAVIRUS, NAA (HOSPITAL ORDER, SEND-OUT TO REF LAB)  MAGNESIUM    EKG None  Radiology Dg Chest Port 1 View  Result Date: 09/27/2018 CLINICAL DATA:  Cough and fever. EXAM: PORTABLE CHEST 1 VIEW COMPARISON:  11/30/2017 FINDINGS: The heart size is mildly enlarged. There is no pneumothorax. No large pleural effusion. There is no focal infiltrate. There is no acute osseous  abnormality. IMPRESSION: 1. No acute cardiopulmonary process. 2. Mild cardiac enlargement. Electronically Signed   By: Constance Holster M.D.   On: 09/27/2018 00:58    Procedures Procedures (including critical care time)  Medications Ordered in ED Medications  sodium chloride 0.9 % bolus 1,000 mL (1,000 mLs Intravenous New Bag/Given 09/27/18 0037)  ketorolac (TORADOL) 30 MG/ML injection 15 mg (15 mg Intravenous Given 09/27/18 0037)  acetaminophen (TYLENOL) tablet 1,000 mg (1,000 mg Oral Given 09/27/18 0037)     Initial Impression / Assessment and Plan / ED Course  I have reviewed the triage vital signs and the nursing  notes.  Pertinent labs & imaging results that were available during my care of the patient were reviewed by me and considered in my medical decision making (see chart for details).        48 yo M with a chief complaints of fever cough congestion and loose stools.  Someone at the patient's work of been tested positive for the novel coronavirus.  He is unsure exactly what was or if he is in close contact with him.  Patient is endorsing some symptoms consistent with the same.  Will check lab work chest x-ray.  Patient is well-appearing nontoxic likely discharge home.  JACORIE ERNSBERGER was evaluated in Emergency Department on 09/27/2018 for the symptoms described in the history of present illness. He/she was evaluated in the context of the global COVID-19 pandemic, which necessitated consideration that the patient might be at risk for infection with the SARS-CoV-2 virus that causes COVID-19. Institutional protocols and algorithms that pertain to the evaluation of patients at risk for COVID-19 are in a state of rapid change based on information released by regulatory bodies including the CDC and federal and state organizations. These policies and algorithms were followed during the patient's care in the ED.   1:38 AM:  I have discussed the diagnosis/risks/treatment options with the  patient and believe the pt to be eligible for discharge home to follow-up with PCP. We also discussed returning to the ED immediately if new or worsening sx occur. We discussed the sx which are most concerning (e.g., sudden worsening sob, fever, inability to tolerate by mouth) that necessitate immediate return. Medications administered to the patient during their visit and any new prescriptions provided to the patient are listed below.  Medications given during this visit Medications  sodium chloride 0.9 % bolus 1,000 mL (1,000 mLs Intravenous New Bag/Given 09/27/18 0037)  ketorolac (TORADOL) 30 MG/ML injection 15 mg (15 mg Intravenous Given 09/27/18 0037)  acetaminophen (TYLENOL) tablet 1,000 mg (1,000 mg Oral Given 09/27/18 0037)     The patient appears reasonably screen and/or stabilized for discharge and I doubt any other medical condition or other [facility] requiring further screening, evaluation, or treatment in the ED at this time prior to discharge.    Final Clinical Impressions(s) / ED Diagnoses   Final diagnoses:  Suspected Covid-19 Virus Infection  Viral upper respiratory tract infection    ED Discharge Orders    None       Deno Etienne, DO 09/27/18 0138

## 2018-09-27 NOTE — Discharge Instructions (Signed)
Person Under Monitoring Name: Reginald Simmons  Location: [address] [place] [address]   Infection Prevention Recommendations for Individuals Confirmed to have, or Being Evaluated for, 2019 Novel Coronavirus (COVID-19) Infection Who Receive Care at Home  Individuals who are confirmed to have, or are being evaluated for, COVID-19 should follow the prevention steps below until a healthcare provider or local or state health department says they can return to normal activities.  Stay home except to get medical care You should restrict activities outside your home, except for getting medical care. Do not go to work, school, or public areas, and do not use public transportation or taxis.  Call ahead before visiting your doctor Before your medical appointment, call the healthcare provider and tell them that you have, or are being evaluated for, COVID-19 infection. This will help the healthcare providers office take steps to keep other people from getting infected. Ask your healthcare provider to call the local or state health department.  Monitor your symptoms Seek prompt medical attention if your illness is worsening (e.g., difficulty breathing). Before going to your medical appointment, call the healthcare provider and tell them that you have, or are being evaluated for, COVID-19 infection. Ask your healthcare provider to call the local or state health department.  Wear a facemask You should wear a facemask that covers your nose and mouth when you are in the same room with other people and when you visit a healthcare provider. People who live with or visit you should also wear a facemask while they are in the same room with you.  Separate yourself from other people in your home As much as possible, you should stay in a different room from other people in your home. Also, you should use a separate bathroom, if available.  Avoid sharing household items You should  not share dishes, drinking glasses, cups, eating utensils, towels, bedding, or other items with other people in your home. After using these items, you should wash them thoroughly with soap and water.  Cover your coughs and sneezes Cover your mouth and nose with a tissue when you cough or sneeze, or you can cough or sneeze into your sleeve. Throw used tissues in a lined trash can, and immediately wash your hands with soap and water for at least 20 seconds or use an alcohol-based hand rub.  Wash your [facility] your hands often and thoroughly with soap and water for at least 20 seconds. You can use an alcohol-based hand sanitizer if soap and water are not available and if your hands are not visibly dirty. Avoid touching your eyes, nose, and mouth with unwashed hands.   Prevention Steps for Caregivers and Household Members of Individuals Confirmed to have, or Being Evaluated for, COVID-19 Infection Being Cared for in the Home  If you live with, or provide care at home for, a person confirmed to have, or being evaluated for, COVID-19 infection please follow these guidelines to prevent infection:  Follow healthcare providers instructions Make sure that you understand and can help the patient follow any healthcare provider instructions for all care.  Provide for the patients basic needs You should help the patient with basic needs in the home and provide support for getting groceries, prescriptions, and other personal needs.  Monitor the patients symptoms If they are getting sicker, call his or her medical provider and tell them that the patient has, or is being evaluated for, COVID-19 infection. This will help the healthcare  providers office take steps to keep other people from getting infected. Ask the healthcare provider to call the local or state health department.  Limit the number of people who have contact with the patient If possible, have only one caregiver for the  patient. Other household members should stay in another home or place of residence. If this is not possible, they should stay in another room, or be separated from the patient as much as possible. Use a separate bathroom, if available. Restrict visitors who do not have an essential need to be in the home.  Keep older adults, very young children, and other sick people away from the patient Keep older adults, very young children, and those who have compromised immune systems or chronic health conditions away from the patient. This includes people with chronic heart, lung, or kidney conditions, diabetes, and cancer.  Ensure good ventilation Make sure that shared spaces in the home have good air flow, such as from an air conditioner or an opened window, weather permitting.  Wash your hands often Wash your hands often and thoroughly with soap and water for at least 20 seconds. You can use an alcohol based hand sanitizer if soap and water are not available and if your hands are not visibly dirty. Avoid touching your eyes, nose, and mouth with unwashed hands. Use disposable paper towels to dry your hands. If not available, use dedicated cloth towels and replace them when they become wet.  Wear a facemask and gloves Wear a disposable facemask at all times in the room and gloves when you touch or have contact with the patients blood, body fluids, and/or secretions or excretions, such as sweat, saliva, sputum, nasal mucus, vomit, urine, or feces.  Ensure the mask fits over your nose and mouth tightly, and do not touch it during use. Throw out disposable facemasks and gloves after using them. Do not reuse. Wash your hands immediately after removing your facemask and gloves. If your personal clothing becomes contaminated, carefully remove clothing and launder. Wash your hands after handling contaminated clothing. Place all used disposable facemasks, gloves, and other waste in a lined container before  disposing them with other household waste. Remove gloves and wash your hands immediately after handling these items.  Do not share dishes, glasses, or other household items with the patient Avoid sharing household items. You should not share dishes, drinking glasses, cups, eating utensils, towels, bedding, or other items with a patient who is confirmed to have, or being evaluated for, COVID-19 infection. After the person uses these items, you should wash them thoroughly with soap and water.  Wash laundry thoroughly Immediately remove and wash clothes or bedding that have blood, body fluids, and/or secretions or excretions, such as sweat, saliva, sputum, nasal mucus, vomit, urine, or feces, on them. Wear gloves when handling laundry from the patient. Read and follow directions on labels of laundry or clothing items and detergent. In general, wash and dry with the warmest temperatures recommended on the label.  Clean all areas the individual has used often Clean all touchable surfaces, such as counters, tabletops, doorknobs, bathroom fixtures, toilets, phones, keyboards, tablets, and bedside tables, every day. Also, clean any surfaces that may have blood, body fluids, and/or secretions or excretions on them. Wear gloves when cleaning surfaces the patient has come in contact with. Use a diluted bleach solution (e.g., dilute bleach with 1 part bleach and 10 parts water) or a household disinfectant with a label that says EPA-registered for coronaviruses. To make  a bleach solution at home, add 1 tablespoon of bleach to 1 quart (4 cups) of water. For a larger supply, add  cup of bleach to 1 gallon (16 cups) of water. Read labels of cleaning products and follow recommendations provided on product labels. Labels contain instructions for safe and effective use of the cleaning product including precautions you should take when applying the product, such as wearing gloves or eye protection and making sure you  have good ventilation during use of the product. Remove gloves and wash hands immediately after cleaning.  Monitor yourself for signs and symptoms of illness Caregivers and household members are considered close contacts, should monitor their health, and will be asked to limit movement outside of the home to the extent possible. Follow the monitoring steps for close contacts listed on the symptom monitoring form.   ? If you have additional questions, contact your local health department or call the epidemiologist on call at [phone] (available 24/7). ? This guidance is subject to change. For the most up-to-date guidance from [facility], please refer to their website: [email or web address]

## 2018-09-27 NOTE — ED Triage Notes (Signed)
Arrives via EMS. Patient has been experiencing fever, chills, dry cough, muscle aches and diarrhea since yesterday. Coworker dx COVID positive last week. Fever of 101 after theraflu. CBG 131.

## 2018-09-28 LAB — NOVEL CORONAVIRUS, NAA (HOSP ORDER, SEND-OUT TO REF LAB; TAT 18-24 HRS): SARS-CoV-2, NAA: DETECTED — AB

## 2018-09-29 ENCOUNTER — Emergency Department (HOSPITAL_COMMUNITY)
Admission: EM | Admit: 2018-09-29 | Discharge: 2018-09-29 | Disposition: A | Discharge status: Home / Self Care | Payer: 59 | Attending: Emergency Medicine | Admitting: Emergency Medicine

## 2018-09-29 ENCOUNTER — Ambulatory Visit (INDEPENDENT_AMBULATORY_CARE_PROVIDER_SITE_OTHER): Payer: 59 | Admitting: Family Medicine

## 2018-09-29 ENCOUNTER — Other Ambulatory Visit: Payer: Self-pay

## 2018-09-29 ENCOUNTER — Encounter (HOSPITAL_COMMUNITY): Payer: Self-pay | Admitting: Emergency Medicine

## 2018-09-29 DIAGNOSIS — U071 COVID-19: Secondary | ICD-10-CM | POA: Diagnosis not present

## 2018-09-29 DIAGNOSIS — Z79899 Other long term (current) drug therapy: Secondary | ICD-10-CM | POA: Insufficient documentation

## 2018-09-29 DIAGNOSIS — Z87891 Personal history of nicotine dependence: Secondary | ICD-10-CM | POA: Diagnosis not present

## 2018-09-29 DIAGNOSIS — R030 Elevated blood-pressure reading, without diagnosis of hypertension: Secondary | ICD-10-CM

## 2018-09-29 MED ORDER — ACETAMINOPHEN 325 MG PO TABS
650.0000 mg | ORAL_TABLET | Freq: Once | ORAL | Status: AC | PRN
  Administered 2018-09-29: 650 mg via ORAL
  Filled 2018-09-29: qty 2

## 2018-09-29 NOTE — Progress Notes (Signed)
Patient ID: Reginald Simmons, male   DOB: November 19, 1970, 48 y.o.   MRN: [id number]   This visit type was conducted due to national recommendations for restrictions regarding the COVID-19 pandemic in an effort to limit this patient's exposure and mitigate transmission in our community.   Virtual Visit via Telephone Note  I connected with Reginald Simmons on 09/29/18 at  2:00 PM EDT by telephone and verified that I am speaking with the correct person using two identifiers.   I discussed the limitations, risks, security and privacy concerns of performing an evaluation and management service by telephone and the availability of in person appointments. I also discussed with the patient that there may be a patient responsible charge related to this service. The patient expressed understanding and agreed to proceed.  Location patient: home Location provider: work or home office Participants present for the call: patient, provider Patient did not have a visit in the prior 7 days to address this/these issue(s).   History of Present Illness: Patient for follow-up regarding recent COVID-19 diagnosis and also elevated blood pressure.  He states that last Friday he developed some nasal congestion followed by cough and low-grade fever and some chills.  No dyspnea.  Went to the ER on the 26th and COVID test was obtained at that time.  He then went back to the ER earlier this morning because of elevated blood pressure.  He states this was initially 174/131 but came down to 113/70 before he left.  He is feeling better overall.  Is now afebrile.  Cough is relatively minimal.  No dyspnea.  No nausea, vomiting, or diarrhea.  No known sick contacts.  He is staying very isolated.  He was here in July and had blood pressure 120/82.  Not treated for hypertension.  No recent headaches.   Observations/Objective: Patient sounds cheerful and well on the phone. I do not appreciate any SOB. Speech and thought processing are  grossly intact. Patient reported vitals:  Assessment and Plan:  #1 COVID-19 infection detected on recent visit to ER.  Patient symptomatically improving.  -He knows to stay quarantined from work until minimum of 10 days from onset of symptoms and only 72 hours after fever resolved. -Plenty fluids and rest.  Follow-up promptly for any increased shortness of breath or other concern  #2 recent elevated blood pressure.  No diagnosis of hypertension.  -Discussed nonpharmacologic management of blood pressure -Monitor closely and be in touch if consistent repeat readings over 140/90  Follow Up Instructions:  -Monitor blood pressure as above and follow-up promptly for any worsening infectious symptoms   99441 5-10 [id number] 11-20 [id number] 21-30 I did not refer this patient for an OV in the next 24 hours for this/these issue(s).  I discussed the assessment and treatment plan with the patient. The patient was provided an opportunity to ask questions and all were answered. The patient agreed with the plan and demonstrated an understanding of the instructions.   The patient was advised to call back or seek an in-person evaluation if the symptoms worsen or if the condition fails to improve as anticipated.  I provided 24 minutes of non-face-to-face time during this encounter.   Carolann Littler, MD

## 2018-09-29 NOTE — ED Notes (Signed)
Patient is calling ride.

## 2018-09-29 NOTE — ED Triage Notes (Signed)
Pt reports having increased blood pressure and was swabbed for COVID-19 on 09/27/2018. Detection was seen on result.

## 2018-09-30 NOTE — ED Provider Notes (Signed)
[facility] DEPT Provider Note   CSN: [id number] Arrival date & time: 09/29/18  0308     History   Chief Complaint Chief Complaint  Patient presents with  . Hypertension  . Fever    HPI LIN GLAZIER is a 48 y.o. male.     HPI Patient is a 48 year old male who is known to be COVID-19 positive.  He presents to the emergency department because of complaints of increasing blood pressure.  He also noticed that his COVID-19 swab was positive on his my chart.  He has no shortness of breath.  He continues having low-grade fevers.  Cough.  No significant chest pain.  Denies weakness of his arms or legs.  No headache.   Past Medical History:  Diagnosis Date  . Allergy   . GERD (gastroesophageal reflux disease)   . Hyperlipidemia   . Onychomycosis     Patient Active Problem List   Diagnosis Date Noted  . COVID-19 virus infection 09/29/2018  . Chest pain 12/01/2017  . Thrombocytopenia ([place]) 12/01/2017  . Obesity (BMI 30-39.9) 12/01/2017  . Neck strain 06/22/2015  . Hyperlipidemia 05/24/2008  . KELOID SCAR 05/24/2008  . HIDRADENITIS SUPPURATIVA 05/24/2008  . HOARSENESS 05/24/2008  . ALLERGIC RHINITIS 12/19/2006  . GERD 11/04/2006    History reviewed. No pertinent surgical history.      Home Medications    Prior to Admission medications   Medication Sig Start Date End Date Taking? Authorizing Provider  atorvastatin (LIPITOR) 80 MG tablet Take 1 tablet (80 mg total) by mouth daily. 08/13/18   Laurey Morale, MD  beclomethasone (QVAR REDIHALER) 80 MCG/ACT inhaler Inhale 2 puffs into the lungs 2 (two) times daily. 04/21/18   Laurey Morale, MD  cetirizine (ZYRTEC) 10 MG tablet Take 10 mg by mouth daily as needed for allergies.     [provider]  Cyanocobalamin (VITAMIN B-12 PO) Take 1-3 tablets by mouth daily.    [provider]  meloxicam (MOBIC) 15 MG tablet Take 1 tablet (15 mg total) by mouth daily. 11/01/16   Laurey Morale, MD    Family History Family History  Problem Relation Age of Onset  . Hypertension Other   . Stroke Other   . Heart disease Other     Social History Social History   Tobacco Use  . Smoking status: Former Research scientist (life sciences)  . Smokeless tobacco: Never Used  Substance Use Topics  . Alcohol use: No    Alcohol/week: 0.0 standard drinks  . Drug use: No     Allergies   Patient has no known allergies.   Review of Systems Review of Systems  All other systems reviewed and are negative.    Physical Exam Updated Vital Signs BP (!) 134/104   Pulse 85   Temp 100.2 F (37.9 C) (Oral)   Resp 19   Ht 6\' 1"  (1.854 m)   Wt (!) 139.7 kg   SpO2 94%   BMI 40.64 kg/m   Physical Exam Vitals signs and nursing note reviewed.  Constitutional:      Appearance: He is well-developed.  HENT:     Head: Normocephalic and atraumatic.  Neck:     Musculoskeletal: Normal range of motion.  Cardiovascular:     Rate and Rhythm: Normal rate and regular rhythm.     Heart sounds: Normal heart sounds.  Pulmonary:     Effort: Pulmonary effort is normal. No respiratory distress.     Breath sounds: Normal breath  sounds.  Abdominal:     General: There is no distension.     Palpations: Abdomen is soft.     Tenderness: There is no abdominal tenderness.  Musculoskeletal: Normal range of motion.  Skin:    General: Skin is warm and dry.  Neurological:     Mental Status: He is alert and oriented to person, place, and time.  Psychiatric:        Judgment: Judgment normal.      ED Treatments / Results  Labs (all labs ordered are listed, but only abnormal results are displayed) Labs Reviewed - No data to display  EKG None  Radiology No results found.  Procedures Procedures (including critical care time)  Medications Ordered in ED Medications  acetaminophen (TYLENOL) tablet 650 mg (650 mg Oral Given 09/29/18 0437)     Initial Impression / Assessment and Plan / ED Course  I have  reviewed the triage vital signs and the nursing notes.  Pertinent labs & imaging results that were available during my care of the patient were reviewed by me and considered in my medical decision making (see chart for details).        Overall well-appearing.  COVID-19 infection found.  Patient given quarantine instructions and instructions for household family members.  Note occasion for additional work-up or treatment at this time.  No shortness of breath or cough.  Ongoing oral hydration and symptom control at home  Final Clinical Impressions(s) / ED Diagnoses   Final diagnoses:  [id number]    ED Discharge Orders    None       Jola Schmidt, MD 09/30/18 1609

## 2018-10-08 ENCOUNTER — Telehealth: Payer: Self-pay | Admitting: Family Medicine

## 2018-10-08 NOTE — Telephone Encounter (Signed)
Copied from [place] [id number]. Topic: General - Other >> Oct 08, 2018  2:48 PM Keene Breath wrote: Reason for CRM: Patient called to ask the nurse to give him a call regarding his FMLA papers and his COVID test for his employer.  Pleas advise and call patient back at [phone]

## 2018-10-09 NOTE — Telephone Encounter (Signed)
Letter has been completed and sent to mychart. Mychart message has been sent to the patient making him aware.

## 2018-10-09 NOTE — Telephone Encounter (Signed)
Patients employer has asked that he contact us to get a note writing him out of work.  Out of work since 09/26/2018. Patient is still having fever, diarrhea, cough, body aches.   [place] for work note?

## 2018-10-09 NOTE — Telephone Encounter (Signed)
Yes we can supply a work note. As for when to go back to work he needs to wait until 3 days after ALL his symptoms are gone. His employer may or may not also require a negative virus test

## 2018-10-26 ENCOUNTER — Other Ambulatory Visit: Payer: Self-pay

## 2018-10-26 DIAGNOSIS — Z20822 Contact with and (suspected) exposure to covid-19: Secondary | ICD-10-CM

## 2018-10-27 ENCOUNTER — Telehealth: Payer: Self-pay | Admitting: Family Medicine

## 2018-10-27 DIAGNOSIS — Z0279 Encounter for issue of other medical certificate: Secondary | ICD-10-CM

## 2018-10-27 LAB — NOVEL CORONAVIRUS, NAA: SARS-CoV-2, NAA: NOT DETECTED

## 2018-10-27 NOTE — Telephone Encounter (Signed)
Need dates for when leave started and ends to compete forms. July 31st aug. 30th.  Dr. Sarajane Jews is aware of dates. Forms are complete.  Patient would like to speak with Dr. Sarajane Jews at his appointment tomorrow. Forms are in Leah[employer] Gas[employer]'s [employer].

## 2018-10-27 NOTE — Telephone Encounter (Signed)
Patient had FMLA forms faxed to our office  Fax forms to: [phone]  Disposition: Dr's Folder

## 2018-10-28 ENCOUNTER — Telehealth (INDEPENDENT_AMBULATORY_CARE_PROVIDER_SITE_OTHER): Payer: 59 | Admitting: Family Medicine

## 2018-10-28 ENCOUNTER — Encounter: Payer: Self-pay | Admitting: Family Medicine

## 2018-10-28 ENCOUNTER — Other Ambulatory Visit: Payer: Self-pay

## 2018-10-28 DIAGNOSIS — U071 COVID-19: Secondary | ICD-10-CM

## 2018-10-28 NOTE — Progress Notes (Signed)
Virtual Visit via Telephone Note  I connected with the patient on 10/28/18 at  1:00 PM EDT by telephone and verified that I am speaking with the correct person using two identifiers. We attempted to connect virtually but we had technical difficulties with the audio and video.     I discussed the limitations, risks, security and privacy concerns of performing an evaluation and management service by telephone and the availability of in person appointments. I also discussed with the patient that there may be a patient responsible charge related to this service. The patient expressed understanding and agreed to proceed.  Location patient: home Location provider: work or home office Participants present for the call: patient, provider Patient did not have a visit in the prior 7 days to address this/these issue(s).   History of Present Illness: Here to ask advice about work. He has been recovering from an infection with the Covid-19 virus, and he now feels back to normal. We filled out paperwork yesterday for him to return to work on 11-02-18. He had a follow up Covid test on 10-26-18 and this negative.    Observations/Objective: Patient sounds cheerful and well on the phone. I do not appreciate any SOB. Speech and thought processing are grossly intact. Patient reported vitals:  Assessment and Plan: He is recovering from a Covid-19 infection. He will return to full work duties on 11-02-18. Alysia Penna, MD   Follow Up Instructions:     [id number] 5-10 [phone] 11-20 [id number] 21-30 I did not refer this patient for an OV in the next 24 hours for this/these issue(s).  I discussed the assessment and treatment plan with the patient. The patient was provided an opportunity to ask questions and all were answered. The patient agreed with the plan and demonstrated an understanding of the instructions.   The patient was advised to call back or seek an in-person evaluation if the symptoms worsen or if the  condition fails to improve as anticipated.  I provided 11 minutes of non-face-to-face time during this encounter.   Alysia Penna, MD

## 2018-10-28 NOTE — Telephone Encounter (Signed)
Forms have been faxed. Nothing further needed.  

## 2018-11-06 ENCOUNTER — Other Ambulatory Visit: Payer: Self-pay

## 2018-11-06 ENCOUNTER — Other Ambulatory Visit: Payer: Self-pay | Admitting: Family Medicine

## 2018-11-06 ENCOUNTER — Other Ambulatory Visit: Payer: 59

## 2018-11-06 DIAGNOSIS — U071 COVID-19: Secondary | ICD-10-CM

## 2018-11-06 LAB — SARS-COV-2 IGG: SARS-COV-2 IgG: 33.9

## 2018-11-06 NOTE — Addendum Note (Signed)
Addended by: Alysia Penna A on: 11/06/2018 01:47 PM   Modules accepted: Orders

## 2018-11-13 ENCOUNTER — Ambulatory Visit (INDEPENDENT_AMBULATORY_CARE_PROVIDER_SITE_OTHER): Payer: 59 | Admitting: Family Medicine

## 2018-11-13 ENCOUNTER — Encounter: Payer: Self-pay | Admitting: Family Medicine

## 2018-11-13 ENCOUNTER — Other Ambulatory Visit: Payer: Self-pay

## 2018-11-13 VITALS — BP 120/80 | HR 75 | Temp 98.3°F | Wt 300.2 lb

## 2018-11-13 DIAGNOSIS — Z0000 Encounter for general adult medical examination without abnormal findings: Secondary | ICD-10-CM

## 2018-11-13 DIAGNOSIS — D696 Thrombocytopenia, unspecified: Secondary | ICD-10-CM | POA: Diagnosis not present

## 2018-11-13 DIAGNOSIS — R739 Hyperglycemia, unspecified: Secondary | ICD-10-CM | POA: Diagnosis not present

## 2018-11-13 LAB — CBC WITH DIFFERENTIAL/PLATELET
Basophils Absolute: 0 10*3/uL (ref 0.0–0.1)
Basophils Relative: 0.4 % (ref 0.0–3.0)
Eosinophils Absolute: 0.1 10*3/uL (ref 0.0–0.7)
Eosinophils Relative: 1.3 % (ref 0.0–5.0)
HCT: 41.4 % (ref 39.0–52.0)
Hemoglobin: 13.7 g/dL (ref 13.0–17.0)
Lymphocytes Relative: 25.9 % (ref 12.0–46.0)
Lymphs Abs: 1.1 10*3/uL (ref 0.7–4.0)
MCHC: 33 g/dL (ref 30.0–36.0)
MCV: 92.5 fl (ref 78.0–100.0)
Monocytes Absolute: 0.5 10*3/uL (ref 0.1–1.0)
Monocytes Relative: 11.5 % (ref 3.0–12.0)
Neutro Abs: 2.6 10*3/uL (ref 1.4–7.7)
Neutrophils Relative %: 60.9 % (ref 43.0–77.0)
Platelets: 121 10*3/uL — ABNORMAL LOW (ref 150.0–400.0)
RBC: 4.47 Mil/uL (ref 4.22–5.81)
RDW: 16 % — ABNORMAL HIGH (ref 11.5–15.5)
WBC: 4.3 10*3/uL (ref 4.0–10.5)

## 2018-11-13 LAB — TSH: TSH: 0.94 u[IU]/mL (ref 0.35–4.50)

## 2018-11-13 LAB — HEMOGLOBIN A1C: Hgb A1c MFr Bld: 6.2 % (ref 4.6–6.5)

## 2018-11-13 LAB — PSA: PSA: 2.58 ng/mL (ref 0.10–4.00)

## 2018-11-13 MED ORDER — ROSUVASTATIN CALCIUM 40 MG PO TABS: 40.0000 mg | ORAL_TABLET | Freq: Every day | ORAL | 3 refills | Status: DC

## 2018-11-13 NOTE — Progress Notes (Signed)
Subjective:    Patient ID: Reginald Simmons, male    DOB: 12-Mar-1970, 48 y.o.   MRN: [id number]  HPI Here for a well exam. He feels well except for some mild SOB that comes and goes. He is recovering from a Covid-19 infection. He tested positive for the virus on 09-27-18 when he had symptoms of fever, fatigue, SOB, cough, and muscle aches. These symptoms gradually resolved over the next 4 weeks. He tested positive for Covid IgG antibodies on 11-06-18. His wife, daughter, and granddaughter all had the infection as well. Other than that, he has been healthy and trying to lose some weight.    Review of Systems  Constitutional: Positive for fatigue.  HENT: Negative.   Eyes: Negative.   Respiratory: Positive for shortness of breath. Negative for cough and wheezing.   Cardiovascular: Negative.   Gastrointestinal: Negative.   Genitourinary: Negative.   Musculoskeletal: Negative.   Skin: Negative.   Neurological: Negative.   Psychiatric/Behavioral: Negative.        Objective:   Physical Exam Constitutional:      General: He is not in acute distress.    Appearance: He is well-developed. He is not diaphoretic.  HENT:     Head: Normocephalic and atraumatic.     Right Ear: External ear normal.     Left Ear: External ear normal.     Nose: Nose normal.     Mouth/Throat:     Pharynx: No oropharyngeal exudate.  Eyes:     General: No scleral icterus.       Right eye: No discharge.        Left eye: No discharge.     Conjunctiva/sclera: Conjunctivae normal.     Pupils: Pupils are equal, round, and reactive to light.  Neck:     Musculoskeletal: Neck supple.     Thyroid: No thyromegaly.     Vascular: No JVD.     Trachea: No tracheal deviation.  Cardiovascular:     Rate and Rhythm: Normal rate and regular rhythm.     Heart sounds: Normal heart sounds. No murmur. No friction rub. No gallop.   Pulmonary:     Effort: Pulmonary effort is normal. No respiratory distress.     Breath sounds:  Normal breath sounds. No wheezing or rales.  Chest:     Chest wall: No tenderness.  Abdominal:     General: Bowel sounds are normal. There is no distension.     Palpations: Abdomen is soft. There is no mass.     Tenderness: There is no abdominal tenderness. There is no guarding or rebound.  Genitourinary:    Penis: Normal. No tenderness.      Scrotum/Testes: Normal.     Prostate: Normal.     Rectum: Normal. Guaiac result negative.  Musculoskeletal: Normal range of motion.        General: No tenderness.  Lymphadenopathy:     Cervical: No cervical adenopathy.  Skin:    General: Skin is warm and dry.     Coloration: Skin is not pale.     Findings: No erythema or rash.  Neurological:     Mental Status: He is alert and oriented to person, place, and time.     Cranial Nerves: No cranial nerve deficit.     Motor: No abnormal muscle tone.     Coordination: Coordination normal.     Deep Tendon Reflexes: Reflexes are normal and symmetric. Reflexes normal.  Psychiatric:  Behavior: Behavior normal.        Thought Content: Thought content normal.        Judgment: Judgment normal.           Assessment & Plan:  Well exam. We discussed diet and exercise. Get fasting labs. He is recovering from the Covid-19 virus.  Alysia Penna, MD

## 2018-12-11 ENCOUNTER — Telehealth: Payer: Self-pay

## 2018-12-11 DIAGNOSIS — R002 Palpitations: Secondary | ICD-10-CM

## 2018-12-11 NOTE — Telephone Encounter (Signed)
Copied from [place] [id number]. Topic: Referral - Request for Referral >> Dec 11, 2018  3:36 PM Rainey Pines A wrote: Has patient seen PCP for this complaint?  *If NO, is insurance requiring patient see PCP for this issue before PCP can refer them? Referral for which specialty: Cardiology Preferred provider/office: No preference Reason for referral: heart flutters

## 2018-12-14 NOTE — Telephone Encounter (Signed)
Noted  

## 2018-12-14 NOTE — Telephone Encounter (Signed)
I did the referral to Cardiology, no OV needed

## 2018-12-14 NOTE — Telephone Encounter (Signed)
Does pt need an ov for a referral to Cardio for heart flutters?

## 2018-12-16 NOTE — Telephone Encounter (Signed)
Pt notified of update and phone number to call to expedite scheduling.

## 2019-01-15 ENCOUNTER — Other Ambulatory Visit: Payer: Self-pay

## 2019-01-15 ENCOUNTER — Ambulatory Visit (INDEPENDENT_AMBULATORY_CARE_PROVIDER_SITE_OTHER): Payer: 59 | Admitting: Interventional Cardiology

## 2019-01-15 ENCOUNTER — Encounter: Payer: Self-pay | Admitting: Interventional Cardiology

## 2019-01-15 VITALS — BP 134/92 | HR 57 | Ht 73.0 in | Wt 309.0 lb

## 2019-01-15 DIAGNOSIS — Z8249 Family history of ischemic heart disease and other diseases of the circulatory system: Secondary | ICD-10-CM | POA: Diagnosis not present

## 2019-01-15 DIAGNOSIS — R002 Palpitations: Secondary | ICD-10-CM

## 2019-01-15 DIAGNOSIS — E782 Mixed hyperlipidemia: Secondary | ICD-10-CM | POA: Diagnosis not present

## 2019-01-15 NOTE — Patient Instructions (Signed)
Medication Instructions:  Your physician recommends that you continue on your current medications as directed. Please refer to the Current Medication list given to you today.  If you need a refill on your cardiac medications before your next appointment, please call your pharmacy.   Lab work: None Ordered  If you have labs (blood work) drawn today and your tests are completely normal, you will receive your results only by: Marland Kitchen MyChart Message (if you have MyChart) OR . A paper copy in the mail If you have any lab test that is abnormal or we need to change your treatment, we will call you to review the results.  Testing/Procedures: None ordered  Follow-Up: . AS NEEDED  Any Other Special Instructions Will Be Listed Below (If Applicable).  Let us know if your palpitations come back and we will consider a monitor

## 2019-01-15 NOTE — Progress Notes (Signed)
Cardiology Office Note   Date:  01/15/2019   ID:  Reginald Simmons [birth year], MRN [id number]  PCP:  Reginald Morale, MD    No chief complaint on file.  palpitations  Wt Readings from Last 3 Encounters:  01/15/19 (!) 309 lb (140.2 kg)  11/13/18 (!) 300 lb 3.2 oz (136.2 kg)  09/29/18 (!) 308 lb (139.7 kg)       History of Present Illness: Reginald Simmons is a 48 y.o. male who is being seen today for the evaluation of palpitations at the request of Reginald Morale, MD.  He had COVID in July 2020.  He had diarrhea, chills, fever, body aches, cough, congestion.  No shortness of breath.  He was able to stay home.    He had some palpitations about 1 months ago.  He had some lightheadedness.  He called EMS.    He does a lot of walking at work.  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  He walks 20K steps a day.  No exertional chest pain.  He has some mild dizziness at work.  No passing out.  He has not been back to the gym since [place].    Denies : Chest pain.  Leg edema. Nitroglycerin use. Orthopnea. Paroxysmal nocturnal dyspnea. Shortness of breath. Syncope.   No smoking on in the past 25 years.  He was lifting weights pre-COVID.    Past Medical History:  Diagnosis Date  . Allergy   . GERD (gastroesophageal reflux disease)   . Hyperlipidemia   . Onychomycosis     No past surgical history on file.   Current Outpatient Medications  Medication Sig Dispense Refill  . beclomethasone (QVAR REDIHALER) 80 MCG/ACT inhaler Inhale 2 puffs into the lungs 2 (two) times daily. 3 Inhaler 3  . cetirizine (ZYRTEC) 10 MG tablet Take 10 mg by mouth daily as needed for allergies.     . Cyanocobalamin (VITAMIN B-12 PO) Take 1-3 tablets by mouth daily.    . meloxicam (MOBIC) 15 MG tablet Take 1 tablet (15 mg total) by mouth daily. 90 tablet 1  . rosuvastatin (CRESTOR) 40 MG tablet Take 1 tablet (40 mg total)  by mouth daily. 90 tablet 3   No current facility-administered medications for this visit.     Allergies:   Patient has no known allergies.    Social History:  The patient  reports that he has quit smoking. He has never used smokeless tobacco. He reports that he does not drink alcohol or use drugs.   Family History:  The patient's family history includes Heart disease in an other family member; Hypertension in an other family member; Stroke in an other family member. Mother had MI- improved lifestyle.   ROS:  Please see the history of present illness.   Otherwise, review of systems are positive for palpitations.   All other systems are reviewed and negative.    PHYSICAL EXAM: VS:  BP (!) 134/92   Pulse (!) 57   Ht 6\' 1"  (1.854 m)   Wt (!) 309 lb (140.2 kg)   SpO2 95%   BMI 40.77 kg/m  , BMI Body mass index is 40.77 kg/m. GEN: Well nourished, well developed, in no acute distress  HEENT: normal  Neck: no JVD, carotid bruits, or masses Cardiac: RRR; no murmurs, rubs, or gallops,no edema  Respiratory:  clear to auscultation bilaterally,  normal work of breathing GI: soft, nontender, nondistended, + BS MS: no deformity or atrophy  Skin: warm and dry, no rash Neuro:  Strength and sensation are intact Psych: euthymic mood, full affect   EKG:   The ekg ordered today demonstrates NSR, no ST changes   Recent Labs: 09/27/2018: ALT 46; BUN 16; Creatinine, Ser 0.97; Magnesium 2.1; Potassium 3.9; Sodium 133 11/13/2018: Hemoglobin 13.7; Platelets 121.0; TSH 0.94   Lipid Panel    Component Value Date/Time   CHOL 202 (H) 08/10/2018 1017   TRIG 89.0 08/10/2018 1017   TRIG 100 12/13/2005 1034   HDL 59.90 08/10/2018 1017   CHOLHDL 3 08/10/2018 1017   VLDL 17.8 08/10/2018 1017   LDLCALC 124 (H) 08/10/2018 1017   LDLDIRECT 201.4 09/18/2012 0957     Other studies Reviewed: Additional studies/ records that were reviewed today with results demonstrating: Negative stress test in 2019..    ASSESSMENT AND PLAN:  1.  Palpitations: No sx now.  If he has more sx, would plan a monitor.  2. Hyperlipidemia: Continue Crestor.  Healthy diet. 3. Family h/o CAD: Mother with early CAD.  We spoke about the importance of preventive therapy.  4. Increase cardio exercise intensity, improve diet, reduce waist size.   Current medicines are reviewed at length with the patient today.  The patient concerns regarding his medicines were addressed.  The following changes have been made:  No change  Labs/ tests ordered today include:  No orders of the defined types were placed in this encounter.   Recommend 150 minutes/week of aerobic exercise Low fat, low carb, high fiber diet recommended  Disposition:   FU in as needed    Signed, Larae Grooms, MD  01/15/2019 2:40 PM    [facility] [address], [place], [place]  [address] Phone: [phone]; Fax: [phone]

## 2019-02-22 ENCOUNTER — Other Ambulatory Visit: Payer: Self-pay

## 2019-02-23 ENCOUNTER — Ambulatory Visit (INDEPENDENT_AMBULATORY_CARE_PROVIDER_SITE_OTHER): Payer: 59 | Admitting: Family Medicine

## 2019-02-23 ENCOUNTER — Encounter: Payer: Self-pay | Admitting: Family Medicine

## 2019-02-23 VITALS — BP 140/80 | HR 84 | Temp 98.0°F | Wt 306.8 lb

## 2019-02-23 DIAGNOSIS — S39012A Strain of muscle, fascia and tendon of lower back, initial encounter: Secondary | ICD-10-CM | POA: Diagnosis not present

## 2019-02-23 MED ORDER — CYCLOBENZAPRINE HCL 10 MG PO TABS: 10.0000 mg | ORAL_TABLET | Freq: Three times a day (TID) | ORAL | 2 refills | Status: DC | PRN

## 2019-02-23 MED ORDER — METHYLPREDNISOLONE 4 MG PO TBPK: ORAL_TABLET | ORAL | 0 refills | Status: DC

## 2019-02-23 NOTE — Progress Notes (Signed)
   Subjective:    Patient ID: Reginald Simmons, male    DOB: 11-15-1970, 48 y.o.   MRN: [id number]  HPI Here for an injury to the lower back that occurred at work on 02-17-19. He was lifting a heavy motor to load it on a truck, and as he turned to the side he felt a sharp twinge in the lower back. Over the next few days this steadily got worse and he developed spasms of the muscles in the lower back. Now the pain is more severe, it is in both sides of the lower back and it radiates down the backs of both legs. No weakness or numbness in the legs. He is taking Ibuprofen with little relief. He saw a chiropractor yesterday by the name of Gracelyn Nurse DC, and they began a proposed 28 session treatment program. He is applying ice and resting. He has not been back to work.    Review of Systems  Constitutional: Negative.   Respiratory: Negative.   Cardiovascular: Negative.   Musculoskeletal: Positive for back pain.  Neurological: Negative for weakness and numbness.       Objective:   Physical Exam Constitutional:      Comments: In pain, walks slowly and stiffly   Cardiovascular:     Rate and Rhythm: Normal rate and regular rhythm.     Pulses: Normal pulses.     Heart sounds: Normal heart sounds.  Pulmonary:     Effort: Pulmonary effort is normal.     Breath sounds: Normal breath sounds.  Musculoskeletal:     Comments: He is very tender in both sides of the lower back, less so in the sciatic notches. SLR are negative and his ROM is almost full            Assessment & Plan:  Lumbar strain, we will write him out of work from 02-18-19 until 03-08-19. He will apply ice. We will give him a Medrol dose pack and Flexeril. Recheck prn.  Alysia Penna, MD

## 2019-03-01 ENCOUNTER — Telehealth: Payer: Self-pay | Admitting: Family Medicine

## 2019-03-01 DIAGNOSIS — S39012A Strain of muscle, fascia and tendon of lower back, initial encounter: Secondary | ICD-10-CM

## 2019-03-01 NOTE — Telephone Encounter (Signed)
Patient is calling for Dr. Sarajane Jews Nurse the workman's comp is going fax some documents to be completed. [employer] will also be faxing documents as well. If someone could be looking out for the docs that would be great please.

## 2019-03-03 NOTE — Telephone Encounter (Signed)
Spoke with patient. Informed him that we did receive paperwork and paperwork is in providers folder to complete.  Patient has been advised that PCP is out this week. Patient also reports he may need an extension on time out since he is going to see Ortho in [place] on 03/12/2019(wants see what they say) before he written back in to work.

## 2019-03-03 NOTE — Telephone Encounter (Signed)
Pt called to follow up on his short term disability paperwork.  He stated tat the company has sent over forms and this time sensitive information is needed asap/ please advise and return Pts call about the matter   [employer] told the Pt that they have reached out several time to the office

## 2019-03-03 NOTE — Telephone Encounter (Signed)
Message Routed to PCP CMA 

## 2019-03-04 NOTE — Telephone Encounter (Signed)
Form was faxed 

## 2019-03-04 NOTE — Telephone Encounter (Signed)
Patient is calling back Message was relayed.

## 2019-03-04 NOTE — Telephone Encounter (Signed)
The form is ready to fax  °

## 2019-03-11 ENCOUNTER — Telehealth: Payer: Self-pay | Admitting: *Deleted

## 2019-03-11 NOTE — Telephone Encounter (Signed)
Copied from [place] [id number]. Topic: General - Other >> Mar 11, 2019  1:02 PM Yvette Rack wrote: Reason for CRM: Pt stated he needs to speak with Roselyn Reef in regards to the short term disability paperwork. Pt stated the paperwork should also include  the dates for this week 03/08/19-03/12/19. Pt requests call back

## 2019-03-11 NOTE — Telephone Encounter (Signed)
Spoke with patient and informed him that PCP dated return date for 03/15/2019. Patient verbalized understanding

## 2019-04-05 ENCOUNTER — Ambulatory Visit: Payer: 59 | Attending: Internal Medicine

## 2019-04-05 DIAGNOSIS — Z20822 Contact with and (suspected) exposure to covid-19: Secondary | ICD-10-CM

## 2019-04-06 LAB — NOVEL CORONAVIRUS, NAA: SARS-CoV-2, NAA: NOT DETECTED

## 2019-04-28 ENCOUNTER — Emergency Department (HOSPITAL_COMMUNITY)
Admission: EM | Admit: 2019-04-28 | Discharge: 2019-04-28 | Disposition: A | Discharge status: Home / Self Care | Payer: 59 | Attending: Emergency Medicine | Admitting: Emergency Medicine

## 2019-04-28 ENCOUNTER — Encounter (HOSPITAL_COMMUNITY): Payer: Self-pay | Admitting: Emergency Medicine

## 2019-04-28 ENCOUNTER — Other Ambulatory Visit: Payer: Self-pay

## 2019-04-28 DIAGNOSIS — E876 Hypokalemia: Secondary | ICD-10-CM | POA: Diagnosis not present

## 2019-04-28 DIAGNOSIS — Z8616 Personal history of COVID-19: Secondary | ICD-10-CM | POA: Insufficient documentation

## 2019-04-28 DIAGNOSIS — Z87891 Personal history of nicotine dependence: Secondary | ICD-10-CM | POA: Insufficient documentation

## 2019-04-28 DIAGNOSIS — Z79899 Other long term (current) drug therapy: Secondary | ICD-10-CM | POA: Insufficient documentation

## 2019-04-28 DIAGNOSIS — I10 Essential (primary) hypertension: Secondary | ICD-10-CM | POA: Diagnosis present

## 2019-04-28 LAB — CBC WITH DIFFERENTIAL/PLATELET
Abs Immature Granulocytes: 0.01 10*3/uL (ref 0.00–0.07)
Basophils Absolute: 0 10*3/uL (ref 0.0–0.1)
Basophils Relative: 0 %
Eosinophils Absolute: 0.1 10*3/uL (ref 0.0–0.5)
Eosinophils Relative: 1 %
HCT: 43.4 % (ref 39.0–52.0)
Hemoglobin: 14.4 g/dL (ref 13.0–17.0)
Immature Granulocytes: 0 %
Lymphocytes Relative: 25 %
Lymphs Abs: 1.4 10*3/uL (ref 0.7–4.0)
MCH: 29.9 pg (ref 26.0–34.0)
MCHC: 33.2 g/dL (ref 30.0–36.0)
MCV: 90.2 fL (ref 80.0–100.0)
Monocytes Absolute: 0.7 10*3/uL (ref 0.1–1.0)
Monocytes Relative: 13 %
Neutro Abs: 3.5 10*3/uL (ref 1.7–7.7)
Neutrophils Relative %: 61 %
Platelets: 113 10*3/uL — ABNORMAL LOW (ref 150–400)
RBC: 4.81 MIL/uL (ref 4.22–5.81)
RDW: 14.6 % (ref 11.5–15.5)
WBC: 5.7 10*3/uL (ref 4.0–10.5)
nRBC: 0 % (ref 0.0–0.2)

## 2019-04-28 LAB — BASIC METABOLIC PANEL
Anion gap: 8 (ref 5–15)
BUN: 20 mg/dL (ref 6–20)
CO2: 26 mmol/L (ref 22–32)
Calcium: 8.6 mg/dL — ABNORMAL LOW (ref 8.9–10.3)
Chloride: 104 mmol/L (ref 98–111)
Creatinine, Ser: 0.93 mg/dL (ref 0.61–1.24)
GFR calc Af Amer: >60 mL/min (ref >60)
GFR calc non Af Amer: >60 mL/min (ref >60)
Glucose, Bld: 105 mg/dL — ABNORMAL HIGH (ref 70–99)
Potassium: 3.3 mmol/L — ABNORMAL LOW (ref 3.5–5.1)
Sodium: 138 mmol/L (ref 135–145)

## 2019-04-28 MED ORDER — ACETAMINOPHEN 325 MG PO TABS
650.0000 mg | ORAL_TABLET | Freq: Once | ORAL | Status: AC
  Administered 2019-04-28: 650 mg via ORAL
  Filled 2019-04-28: qty 2

## 2019-04-28 MED ORDER — POTASSIUM CHLORIDE CRYS ER 20 MEQ PO TBCR
40.0000 meq | EXTENDED_RELEASE_TABLET | Freq: Once | ORAL | Status: AC
  Administered 2019-04-28: 40 meq via ORAL
  Filled 2019-04-28: qty 2

## 2019-04-28 NOTE — ED Provider Notes (Signed)
[facility] DEPT Provider Note   CSN: [id number] Arrival date & time: 04/28/19  0011     History Chief Complaint  Patient presents with  . Hypertension    Reginald Simmons is a 49 y.o. male.  The history is provided by the patient.  Hypertension This is a new problem. The problem occurs constantly. The problem has been gradually worsening. Associated symptoms include headaches. Pertinent negatives include no chest pain, no abdominal pain and no shortness of breath. Nothing aggravates the symptoms. Nothing relieves the symptoms.   Patient with history of hyperlipidemia presents with concern for hypertension.  Patient reports that he got home from work and was feeling lightheaded/fatigue.  He reports checking his blood pressure and it was elevated.  Reported mild palpitations and mild headache No focal weakness.  No chest pain or shortness of breath.  He has otherwise been feeling well He reports his doctors told him that his blood pressures been slowly rising but he has never been on medicines    Past Medical History:  Diagnosis Date  . Allergy   . GERD (gastroesophageal reflux disease)   . Hyperlipidemia   . Onychomycosis     Patient Active Problem List   Diagnosis Date Noted  . Lumbar strain 03/04/2019  . Morbid (severe) obesity due to excess calories ([place]) 11/13/2018  . COVID-19 virus infection 09/29/2018  . Chest pain 12/01/2017  . Thrombocytopenia ([place]) 12/01/2017  . Obesity (BMI 30-39.9) 12/01/2017  . Neck strain 06/22/2015  . Hyperlipidemia 05/24/2008  . KELOID SCAR 05/24/2008  . HIDRADENITIS SUPPURATIVA 05/24/2008  . HOARSENESS 05/24/2008  . ALLERGIC RHINITIS 12/19/2006  . GERD 11/04/2006    History reviewed. No pertinent surgical history.     Family History  Problem Relation Age of Onset  . Hypertension Other   . Stroke Other   . Heart disease Other     Social History   Tobacco Use  . Smoking status: Former Research scientist (life sciences)    . Smokeless tobacco: Never Used  Substance Use Topics  . Alcohol use: No    Alcohol/week: 0.0 standard drinks  . Drug use: No    Home Medications Prior to Admission medications   Medication Sig Start Date End Date Taking? Authorizing Provider  beclomethasone (QVAR REDIHALER) 80 MCG/ACT inhaler Inhale 2 puffs into the lungs 2 (two) times daily. Patient taking differently: Inhale 2 puffs into the lungs daily as needed (sob).  04/21/18  Yes Laurey Morale, MD  cetirizine (ZYRTEC) 10 MG tablet Take 10 mg by mouth daily as needed for allergies.    Yes [provider]  rosuvastatin (CRESTOR) 40 MG tablet Take 1 tablet (40 mg total) by mouth daily. 11/13/18  Yes Laurey Morale, MD  cyclobenzaprine (FLEXERIL) 10 MG tablet Take 1 tablet (10 mg total) by mouth 3 (three) times daily as needed for muscle spasms. Patient not taking: Reported on 04/28/2019 02/23/19   Laurey Morale, MD  methylPREDNISolone (MEDROL DOSEPAK) 4 MG TBPK tablet As directed Patient not taking: Reported on 04/28/2019 02/23/19   Laurey Morale, MD    Allergies    Patient has no known allergies.  Review of Systems   Review of Systems  Constitutional: Positive for fatigue. Negative for fever.  Respiratory: Negative for shortness of breath.   Cardiovascular: Negative for chest pain.  Gastrointestinal: Negative for abdominal pain.  Neurological: Positive for dizziness and headaches. Negative for speech difficulty, weakness and numbness.  All other systems reviewed and are negative.  Physical Exam Updated Vital Signs BP (!) 164/102 (BP Location: Left Arm)   Pulse 95   Temp 99 F (37.2 C) (Oral)   Resp 20   Ht 1.854 m (6\' 1" )   Wt (!) 140.2 kg   SpO2 98%   BMI 40.77 kg/m   Physical Exam CONSTITUTIONAL: Well developed/well nourished HEAD: Normocephalic/atraumatic EYES: EOMI/PERRL, no nystagmus,no ptosis ENMT: Mucous membranes moist NECK: supple no meningeal signs CV: S1/S2 noted, no murmurs/rubs/gallops  noted LUNGS: Lungs are clear to auscultation bilaterally, no apparent distress ABDOMEN: soft, nontender, no rebound or guarding GU:no cva tenderness NEURO:Awake/alert, face symmetric, no arm or leg drift is noted Equal 5/5 strength with shoulder abduction, elbow flex/extension, wrist flex/extension in upper extremities and equal hand grips bilaterally Equal 5/5 strength with hip flexion,knee flex/extension, foot dorsi/plantar flexion Cranial nerves 3/4/5/6/09/09/08/11/12 tested and intact No past pointing Sensation to light touch intact in all extremities EXTREMITIES: pulses normal, full ROM SKIN: warm, color normal PSYCH: no abnormalities of mood noted   ED Results / Procedures / Treatments   Labs (all labs ordered are listed, but only abnormal results are displayed) Labs Reviewed  BASIC METABOLIC PANEL - Abnormal; Notable for the following components:      Result Value   Potassium 3.3 (*)    Glucose, Bld 105 (*)    Calcium 8.6 (*)    All other components within normal limits  CBC WITH DIFFERENTIAL/PLATELET - Abnormal; Notable for the following components:   Platelets 113 (*)    All other components within normal limits    EKG EKG Interpretation  Date/Time:  Wednesday April 28 2019 00:57:13 EST Ventricular Rate:  89 PR Interval:    QRS Duration: 91 QT Interval:  375 QTC Calculation: 457 R Axis:   28 Text Interpretation: Sinus rhythm Prolonged PR interval Probable left atrial enlargement Nonspecific T abnormalities, anterior leads Confirmed by Ripley Fraise [phone]) on 04/28/2019 1:04:58 AM Prehospital EKG similar morphology to ER EKG  Radiology No results found.  Procedures Procedures    Medications Ordered in ED Medications  acetaminophen (TYLENOL) tablet 650 mg (650 mg Oral Given 04/28/19 0145)  potassium chloride SA (KLOR-CON) CR tablet 40 mEq (40 mEq Oral Given 04/28/19 0316)    ED Course  I have reviewed the triage vital signs and the nursing  notes.  Pertinent labs results that were available during my care of the patient were reviewed by me and considered in my medical decision making (see chart for details).    MDM Rules/Calculators/A&P                      Patient presented for fatigue and mild dizziness.  Likely due to elevated blood pressure.  He had no signs of stroke.  He denied any chest pain. He is very well-appearing.  No focal neuro deficits.  He can ambulate without ataxia  Blood pressure spontaneously improved in the ER.  He had mild hypokalemia. He also has thrombocytopenia of unclear etiology that has been seen previously  At this point, patient has a good relationship with his PCP and can get close follow-up for recheck of his blood pressure.  I advised him to measure his blood pressure each day and record it and follow-up with his PCP in a week.  If still persistently high he will likely need to go on BP meds  We discussed strict ER return precautions Final Clinical Impression(s) / ED Diagnoses Final diagnoses:  Essential hypertension  Hypokalemia  Rx / DC Orders ED Discharge Orders    None       Ripley Fraise, MD 04/28/19 [id number]

## 2019-04-28 NOTE — ED Triage Notes (Signed)
Pt arrived via EMS from home. Pt felt lightheaded around 5pm when he got home from work. Pt was worried that it was his blood pressure. First pressure with EMS was 184/82. Pt has dizziness, but no headache, chest pain, or nausea or vomiting.

## 2019-04-28 NOTE — ED Notes (Signed)
Patient was verbalized discharge instructions. Pt had no further questions at this time. NAD. 

## 2019-04-29 ENCOUNTER — Other Ambulatory Visit: Payer: Self-pay

## 2019-04-30 ENCOUNTER — Encounter: Payer: Self-pay | Admitting: Family Medicine

## 2019-04-30 ENCOUNTER — Ambulatory Visit (INDEPENDENT_AMBULATORY_CARE_PROVIDER_SITE_OTHER): Payer: 59 | Admitting: Family Medicine

## 2019-04-30 VITALS — BP 150/82 | HR 90 | Temp 98.0°F | Wt 311.8 lb

## 2019-04-30 DIAGNOSIS — I10 Essential (primary) hypertension: Secondary | ICD-10-CM | POA: Diagnosis not present

## 2019-04-30 DIAGNOSIS — U071 COVID-19: Secondary | ICD-10-CM | POA: Diagnosis not present

## 2019-04-30 MED ORDER — LOSARTAN POTASSIUM 50 MG PO TABS: 50.0000 mg | ORAL_TABLET | Freq: Every day | ORAL | 3 refills | Status: DC

## 2019-04-30 NOTE — Progress Notes (Signed)
   Subjective:    Patient ID: Reginald Simmons, male    DOB: 07-09-1970, 49 y.o.   MRN: [id number]  HPI Here to follow up an ER visit on 04-28-19 for HTN. He presented that night because he had been experiencing lightheadedness and mild headaches all that day. No chest pain or SOB. When he got home from work his wife checked his BP and it was 155/98. He went to the ER and it was as high as 170's over 110's at one point. His exam was otherwise normal, as were labs and EKG. He was told to follow up with [place] for treatment. Since then he has felt better with no lightheadedness. His BP at home averages 140s and 150s over 90s. He has a strong family hx of HTN.    Review of Systems  Constitutional: Negative.   Respiratory: Negative.   Cardiovascular: Negative.   Neurological: Positive for light-headedness. Negative for dizziness and headaches.       Objective:   Physical Exam Constitutional:      Appearance: Normal appearance. He is not ill-appearing.  Cardiovascular:     Rate and Rhythm: Normal rate and regular rhythm.     Pulses: Normal pulses.     Heart sounds: Normal heart sounds.  Pulmonary:     Effort: Pulmonary effort is normal.     Breath sounds: Normal breath sounds.  Musculoskeletal:     Cervical back: No tenderness.     Right lower leg: No edema.     Left lower leg: No edema.  Lymphadenopathy:     Cervical: No cervical adenopathy.  Neurological:     Mental Status: He is alert.           Assessment & Plan:  He has HTN, and we will start him on Losartan 50 mg daily. We discussed losing weight and limiting his sodium intake. Recheck in one month. Alysia Penna, MD

## 2019-05-03 LAB — SARS COV-2 SEROLOGY(COVID-19)AB(IGG,IGM),IMMUNOASSAY
SARS CoV-2 AB IgG: POSITIVE — AB
SARS CoV-2 IgM: NEGATIVE

## 2019-05-05 ENCOUNTER — Telehealth: Payer: Self-pay | Admitting: *Deleted

## 2019-05-05 NOTE — Telephone Encounter (Signed)
Patient called after hours line. Patient reports he has new Rx for blood pressure. wants to know if can take more than one a day if needed. Losartan 50 mg one tab daily. Feeling light headed. Was not taking blood pressure. Patient was advised to check blood pressure in AM and PM and keep a record to go overwith PCP. That way adjustments can be made to medication. Do not take extra medication based on being light headed, because cause may not be high blood pressure. No new or worsening symptoms. Had a light headache earlier that is getting better with Tylenol. Patient was  advised will need to call during office hours for lab results.

## 2019-05-05 NOTE — Telephone Encounter (Signed)
Agreed -

## 2019-05-27 ENCOUNTER — Other Ambulatory Visit: Payer: Self-pay

## 2019-05-28 ENCOUNTER — Ambulatory Visit (INDEPENDENT_AMBULATORY_CARE_PROVIDER_SITE_OTHER): Payer: 59 | Admitting: Family Medicine

## 2019-05-28 ENCOUNTER — Encounter: Payer: Self-pay | Admitting: Family Medicine

## 2019-05-28 VITALS — BP 122/60 | HR 85 | Temp 97.6°F | Ht 73.0 in | Wt 305.0 lb

## 2019-05-28 DIAGNOSIS — I10 Essential (primary) hypertension: Secondary | ICD-10-CM | POA: Diagnosis not present

## 2019-05-28 MED ORDER — LOSARTAN POTASSIUM 50 MG PO TABS: 50.0000 mg | ORAL_TABLET | Freq: Every day | ORAL | 3 refills | Status: DC

## 2019-05-28 NOTE — Progress Notes (Signed)
   Subjective:    Patient ID: Reginald Simmons, male    DOB: 1970-10-20, 49 y.o.   MRN: [id number]  HPI Here to follow up on HTN. One month ago he started on Losartan 50 mg daily, and this has worked very well. His BP at home is stable and he feels better. He has limited his sodium intake quite a bit.    Review of Systems  Constitutional: Negative.   Respiratory: Negative.   Cardiovascular: Negative.   Neurological: Negative.        Objective:   Physical Exam Constitutional:      Appearance: Normal appearance.  Cardiovascular:     Rate and Rhythm: Normal rate and regular rhythm.     Pulses: Normal pulses.     Heart sounds: Normal heart sounds.  Pulmonary:     Effort: Pulmonary effort is normal.     Breath sounds: Normal breath sounds.  Neurological:     Mental Status: He is alert.           Assessment & Plan:  HTN, now well controlled. Stay on current regimen. Recheck in 3 months.  Alysia Penna, MD

## 2019-07-02 ENCOUNTER — Ambulatory Visit: Payer: 59 | Admitting: Family Medicine

## 2019-07-15 ENCOUNTER — Encounter (HOSPITAL_COMMUNITY): Payer: Self-pay | Admitting: Emergency Medicine

## 2019-07-15 ENCOUNTER — Emergency Department (HOSPITAL_COMMUNITY)
Admission: EM | Admit: 2019-07-15 | Discharge: 2019-07-15 | Disposition: A | Discharge status: Home / Self Care | Payer: 59 | Attending: Emergency Medicine | Admitting: Emergency Medicine

## 2019-07-15 DIAGNOSIS — Z8616 Personal history of COVID-19: Secondary | ICD-10-CM | POA: Insufficient documentation

## 2019-07-15 DIAGNOSIS — I10 Essential (primary) hypertension: Secondary | ICD-10-CM | POA: Diagnosis not present

## 2019-07-15 DIAGNOSIS — R55 Syncope and collapse: Secondary | ICD-10-CM | POA: Insufficient documentation

## 2019-07-15 DIAGNOSIS — Z87891 Personal history of nicotine dependence: Secondary | ICD-10-CM | POA: Diagnosis not present

## 2019-07-15 DIAGNOSIS — Z79899 Other long term (current) drug therapy: Secondary | ICD-10-CM | POA: Diagnosis not present

## 2019-07-15 LAB — BASIC METABOLIC PANEL
Anion gap: 9 (ref 5–15)
BUN: 15 mg/dL (ref 6–20)
CO2: 25 mmol/L (ref 22–32)
Calcium: 8.9 mg/dL (ref 8.9–10.3)
Chloride: 106 mmol/L (ref 98–111)
Creatinine, Ser: 0.77 mg/dL (ref 0.61–1.24)
GFR calc Af Amer: >60 mL/min (ref >60)
GFR calc non Af Amer: >60 mL/min (ref >60)
Glucose, Bld: 100 mg/dL — ABNORMAL HIGH (ref 70–99)
Potassium: 3.6 mmol/L (ref 3.5–5.1)
Sodium: 140 mmol/L (ref 135–145)

## 2019-07-15 LAB — CBC
HCT: 44.6 % (ref 39.0–52.0)
Hemoglobin: 14.3 g/dL (ref 13.0–17.0)
MCH: 29.5 pg (ref 26.0–34.0)
MCHC: 32.1 g/dL (ref 30.0–36.0)
MCV: 92 fL (ref 80.0–100.0)
Platelets: 114 10*3/uL — ABNORMAL LOW (ref 150–400)
RBC: 4.85 MIL/uL (ref 4.22–5.81)
RDW: 14.3 % (ref 11.5–15.5)
WBC: 5.8 10*3/uL (ref 4.0–10.5)
nRBC: 0 % (ref 0.0–0.2)

## 2019-07-15 LAB — CBG MONITORING, ED: Glucose-Capillary: 102 mg/dL — ABNORMAL HIGH (ref 70–99)

## 2019-07-15 NOTE — ED Provider Notes (Signed)
[facility] DEPT Provider Note   CSN: [id number] Arrival date & time: 07/15/19  1033     History Chief Complaint  Patient presents with  . Near Syncope    Reginald Simmons is a 49 y.o. male with history of HTN and HLD who presents with near syncope. 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 His coworker got him to a chair and he sat down and drank some water. EMS was called and BP was elevated in the 170s. Since being transported here he feels better and BP is improving. He reports feeling overall well recently. He has had several ED visits for high BP and was just started on Losartan in February. He takes this regularly once at night. He has seen cardiology in the past for palpitations and was told that they were benign. He denies severe headache, chest pain, SOB, abdominal pain today. He feels a lot better now.  HPI     Past Medical History:  Diagnosis Date  . Allergy   . GERD (gastroesophageal reflux disease)   . Hyperlipidemia   . Onychomycosis     Patient Active Problem List   Diagnosis Date Noted  . Essential hypertension 04/30/2019  . Morbid (severe) obesity due to excess calories ([place]) 11/13/2018  . COVID-19 virus infection 09/29/2018  . Thrombocytopenia ([place]) 12/01/2017  . Obesity (BMI 30-39.9) 12/01/2017  . Neck strain 06/22/2015  . Hyperlipidemia 05/24/2008  . KELOID SCAR 05/24/2008  . ALLERGIC RHINITIS 12/19/2006  . GERD 11/04/2006    History reviewed. No pertinent surgical history.     Family History  Problem Relation Age of Onset  . Hypertension Other   . Stroke Other   . Heart disease Other     Social History   Tobacco Use  . Smoking status: Former Research scientist (life sciences)  . Smokeless tobacco: Never Used  Substance Use Topics  .  Alcohol use: No    Alcohol/week: 0.0 standard drinks  . Drug use: No    Home Medications Prior to Admission medications   Medication Sig Start Date End Date Taking? Authorizing Provider  cetirizine (ZYRTEC) 10 MG tablet Take 10 mg by mouth daily.    Yes [provider]  losartan (COZAAR) 50 MG tablet Take 1 tablet (50 mg total) by mouth daily. 05/28/19  Yes Laurey Morale, MD  polyvinyl alcohol (LIQUIFILM TEARS) 1.4 % ophthalmic solution Place 1 drop into both eyes as needed for dry eyes.   Yes [provider]  rosuvastatin (CRESTOR) 40 MG tablet Take 1 tablet (40 mg total) by mouth daily. 11/13/18  Yes Laurey Morale, MD  beclomethasone (QVAR REDIHALER) 80 MCG/ACT inhaler Inhale 2 puffs into the lungs 2 (two) times daily. Patient not taking: Reported on 07/15/2019 04/21/18   Laurey Morale, MD    Allergies    Patient has no known allergies.  Review of Systems   Review of Systems  Constitutional: Negative for chills and fever.  Respiratory: Negative for shortness of breath.   Cardiovascular: Negative for chest pain.  Gastrointestinal: Negative for abdominal pain.  Neurological: Positive for light-headedness. Negative for syncope and headaches.  All other systems reviewed and are negative.   Physical Exam Updated Vital Signs BP 131/83 (BP Location: Left Arm)  Pulse (!) 57   Temp 98.6 F (37 C) (Oral)   Resp 18   SpO2 98%   Physical Exam Vitals and nursing note reviewed.  Constitutional:      General: He is not in acute distress.    Appearance: Normal appearance. He is well-developed. He is not ill-appearing.     Comments: Calm, cooperative NAD  HENT:     Head: Normocephalic and atraumatic.  Eyes:     General: No scleral icterus.       Right eye: No discharge.        Left eye: No discharge.     Conjunctiva/sclera: Conjunctivae normal.     Pupils: Pupils are equal, round, and reactive to light.  Cardiovascular:     Rate and Rhythm: Normal rate and  regular rhythm.  Pulmonary:     Effort: Pulmonary effort is normal. No respiratory distress.     Breath sounds: Normal breath sounds.  Abdominal:     General: There is no distension.     Palpations: Abdomen is soft.     Tenderness: There is no abdominal tenderness.  Musculoskeletal:     Cervical back: Normal range of motion.     Right lower leg: No edema.     Left lower leg: No edema.  Skin:    General: Skin is warm and dry.  Neurological:     Mental Status: He is alert and oriented to person, place, and time.  Psychiatric:        Behavior: Behavior normal.     ED Results / Procedures / Treatments   Labs (all labs ordered are listed, but only abnormal results are displayed) Labs Reviewed  BASIC METABOLIC PANEL - Abnormal; Notable for the following components:      Result Value   Glucose, Bld 100 (*)    All other components within normal limits  CBC - Abnormal; Notable for the following components:   Platelets 114 (*)    All other components within normal limits  CBG MONITORING, ED - Abnormal; Notable for the following components:   Glucose-Capillary 102 (*)    All other components within normal limits    EKG None  Radiology No results found.  Procedures Procedures (including critical care time)  Medications Ordered in ED Medications - No data to display  ED Course  I have reviewed the triage vital signs and the nursing notes.  Pertinent labs & imaging results that were available during my care of the patient were reviewed by me and considered in my medical decision making (see chart for details).  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 He had prodromal symptoms and didn't actually pass out. BP on scene was elevated but here it appears normal. Episodes vasovagal in nature. He states he drinks a lot of water and hasn't had positional lightheadedness so unlikely orthostatic. EKG is NSR. His exam  is unremarkable. Labs were obtain which show chronic thrombocytopenia which is stable. Pt feels back to baseline. Advised PCP f/u.  MDM Rules/Calculators/A&P                       Final Clinical Impression(s) / ED Diagnoses Final diagnoses:  Near syncope    Rx / DC Orders ED Discharge Orders    None       Recardo Evangelist, PA-C 07/15/19 1441    Margette Fast, MD 07/16/19 [phone]

## 2019-07-15 NOTE — Discharge Instructions (Signed)
Please follow up with your doctor Return to the ER for worsening symptoms

## 2019-07-15 NOTE — ED Notes (Signed)
Pt aware urine sample needed. Unable to go at this time.

## 2019-07-15 NOTE — ED Notes (Signed)
Pt ambulatory to room from triage.  

## 2019-07-15 NOTE — ED Triage Notes (Signed)
Per EMS-states he got dizzy at work..complains of-worker helped him to chair-states this has happened before when his BP is elevated-had a change in BP med-currently taking Losartan-CBG 102

## 2019-07-19 ENCOUNTER — Ambulatory Visit (INDEPENDENT_AMBULATORY_CARE_PROVIDER_SITE_OTHER): Payer: 59 | Admitting: Family Medicine

## 2019-07-19 ENCOUNTER — Encounter: Payer: Self-pay | Admitting: Family Medicine

## 2019-07-19 ENCOUNTER — Other Ambulatory Visit: Payer: Self-pay

## 2019-07-19 VITALS — BP 130/70 | HR 71 | Temp 97.5°F | Wt 308.0 lb

## 2019-07-19 DIAGNOSIS — I10 Essential (primary) hypertension: Secondary | ICD-10-CM | POA: Diagnosis not present

## 2019-07-19 MED ORDER — METOPROLOL SUCCINATE ER 50 MG PO TB24: 50.0000 mg | ORAL_TABLET | Freq: Every day | ORAL | 3 refills | Status: DC

## 2019-07-19 NOTE — Progress Notes (Signed)
   Subjective:    Patient ID: CROSS GOLDBECK, male    DOB: 12-28-1970, 49 y.o.   MRN: [id number]  HPI Here to follow up an ER visit on 07-15-19. That day he was working to help load equipment onto a truck when he felt very lightheaded and almost passed out. His coworkers called EMS and they said his systolic BP was in the [id number]. At the ERE the BP was back to normal. His exam, EKG ,and labs were all normal. He was told to follow up with [place]. Since then he has felt fine.    Review of Systems  Constitutional: Negative.   Respiratory: Negative.   Cardiovascular: Negative.   Neurological: Positive for light-headedness. Negative for dizziness, tremors, seizures, syncope, facial asymmetry, speech difficulty, weakness, numbness and headaches.       Objective:   Physical Exam Constitutional:      Appearance: Normal appearance. He is not ill-appearing.  Cardiovascular:     Rate and Rhythm: Normal rate and regular rhythm.     Pulses: Normal pulses.     Heart sounds: Normal heart sounds.  Pulmonary:     Effort: Pulmonary effort is normal.     Breath sounds: Normal breath sounds.  Neurological:     Mental Status: He is alert.           Assessment & Plan:  It seems his BP has been labile so we will stop the Losartan. Instead he will start on Metoprolol succinate 50 mg daily. Recheck in 4 weeks.  Alysia Penna, MD

## 2019-08-12 ENCOUNTER — Other Ambulatory Visit: Payer: Self-pay

## 2019-08-13 ENCOUNTER — Ambulatory Visit (INDEPENDENT_AMBULATORY_CARE_PROVIDER_SITE_OTHER): Payer: 59 | Admitting: Family Medicine

## 2019-08-13 ENCOUNTER — Encounter: Payer: Self-pay | Admitting: Family Medicine

## 2019-08-13 VITALS — BP 138/84 | HR 73 | Temp 98.7°F | Wt 309.6 lb

## 2019-08-13 DIAGNOSIS — I10 Essential (primary) hypertension: Secondary | ICD-10-CM | POA: Diagnosis not present

## 2019-08-13 MED ORDER — METOPROLOL SUCCINATE ER 50 MG PO TB24: 50.0000 mg | ORAL_TABLET | Freq: Every day | ORAL | 3 refills | Status: DC

## 2019-08-13 NOTE — Progress Notes (Signed)
° °  Subjective:    Patient ID: Reginald Simmons, male    DOB: 06-08-1970, 49 y.o.   MRN: [id number]  HPI Here to follow up on HTN. His BP is well controlled and he feels fine.    Review of Systems  Constitutional: Negative.   Respiratory: Negative.   Cardiovascular: Negative.   Neurological: Negative.        Objective:   Physical Exam Constitutional:      Appearance: Normal appearance.  Cardiovascular:     Rate and Rhythm: Normal rate and regular rhythm.     Pulses: Normal pulses.     Heart sounds: Normal heart sounds.  Pulmonary:     Effort: Pulmonary effort is normal.     Breath sounds: Normal breath sounds.  Musculoskeletal:     Right lower leg: No edema.     Left lower leg: No edema.  Neurological:     Mental Status: He is alert.           Assessment & Plan:  HTN, well controlled. He is still working on weight loss.  Alysia Penna, MD

## 2019-11-09 ENCOUNTER — Other Ambulatory Visit: Payer: Self-pay

## 2019-11-09 ENCOUNTER — Telehealth (INDEPENDENT_AMBULATORY_CARE_PROVIDER_SITE_OTHER): Payer: 59 | Admitting: Family Medicine

## 2019-11-09 ENCOUNTER — Encounter: Payer: Self-pay | Admitting: Family Medicine

## 2019-11-09 DIAGNOSIS — Z209 Contact with and (suspected) exposure to unspecified communicable disease: Secondary | ICD-10-CM | POA: Diagnosis not present

## 2019-11-09 NOTE — Progress Notes (Signed)
   Subjective:    Patient ID: Reginald Simmons, male    DOB: 19-Dec-1970, 49 y.o.   MRN: [id number]  HPI Virtual Visit via Telephone Note  I connected with the patient on 11/09/19 at  3:00 PM EDT by telephone and verified that I am speaking with the correct person using two identifiers.   I discussed the limitations, risks, security and privacy concerns of performing an evaluation and management service by telephone and the availability of in person appointments. I also discussed with the patient that there may be a patient responsible charge related to this service. The patient expressed understanding and agreed to proceed.  Location patient: home Location provider: work or home office Participants present for the call: patient, provider Patient did not have a visit in the prior 7 days to address this/these issue(s).   History of Present Illness: Here asking advice about the Covid vaccine. First he asks if I prefer one vaccine over the others. Second since he has tested positive for antibodies to the Covid virus, does he still need the vaccine and would it be safe for him to get?    Observations/Objective: Patient sounds cheerful and well on the phone. I do not appreciate any SOB. Speech and thought processing are grossly intact. Patient reported vitals:  Assessment and Plan: I told him I prefer he get either the [place] or the [employer] vaccine. Yes it would be safe for him to get the vaccine even though he has Covid antibodies. Finally yes I still encourage him to get the vaccine even if he has Covid antibodies, because he can only have potential immunity against one variant of the virus (whereas the vaccine can confer protection against all the possible variants). He said he understands all this and he does plan to get the Vaccine.  Alysia Penna, MD   Follow Up Instructions:     [phone] 11-20 [phone] 21-30 I did not refer this patient for an OV in the next 24 hours for  this/these issue(s).  I discussed the assessment and treatment plan with the patient. The patient was provided an opportunity to ask questions and all were answered. The patient agreed with the plan and demonstrated an understanding of the instructions.   The patient was advised to call back or seek an in-person evaluation if the symptoms worsen or if the condition fails to improve as anticipated.  I provided 12 minutes of non-face-to-face time during this encounter.   Alysia Penna, MD    Review of Systems     Objective:   Physical Exam        Assessment & Plan:

## 2019-11-09 NOTE — Progress Notes (Signed)
   Subjective:    Patient ID: Reginald Simmons, male    DOB: Oct 17, 1970, 49 y.o.   MRN: [id number]  HPI    Review of Systems     Objective:   Physical Exam        Assessment & Plan:

## 2019-11-15 ENCOUNTER — Encounter: Payer: 59 | Admitting: Family Medicine

## 2019-12-02 ENCOUNTER — Other Ambulatory Visit: Payer: Self-pay

## 2019-12-03 ENCOUNTER — Encounter: Payer: Self-pay | Admitting: Family Medicine

## 2019-12-03 ENCOUNTER — Ambulatory Visit (INDEPENDENT_AMBULATORY_CARE_PROVIDER_SITE_OTHER): Payer: 59 | Admitting: Family Medicine

## 2019-12-03 VITALS — BP 136/76 | HR 62 | Temp 98.9°F | Resp 16 | Ht 73.0 in | Wt 298.6 lb

## 2019-12-03 DIAGNOSIS — Z0000 Encounter for general adult medical examination without abnormal findings: Secondary | ICD-10-CM

## 2019-12-03 MED ORDER — ROSUVASTATIN CALCIUM 40 MG PO TABS: 40.0000 mg | ORAL_TABLET | Freq: Every day | ORAL | 3 refills | Status: DC

## 2019-12-03 NOTE — Progress Notes (Signed)
   Subjective:    Patient ID: Reginald Simmons, male    DOB: January 19, 1971, 49 y.o.   MRN: [id number]  HPI Here for a well exam. He feels fine.    Review of Systems  Constitutional: Negative.   HENT: Negative.   Eyes: Negative.   Respiratory: Negative.   Cardiovascular: Negative.   Gastrointestinal: Negative.   Genitourinary: Negative.   Musculoskeletal: Negative.   Skin: Negative.   Neurological: Negative.   Psychiatric/Behavioral: Negative.        Objective:   Physical Exam Constitutional:      General: He is not in acute distress.    Appearance: He is well-developed. He is not diaphoretic.  HENT:     Head: Normocephalic and atraumatic.     Right Ear: External ear normal.     Left Ear: External ear normal.     Nose: Nose normal.     Mouth/Throat:     Pharynx: No oropharyngeal exudate.  Eyes:     General: No scleral icterus.       Right eye: No discharge.        Left eye: No discharge.     Conjunctiva/sclera: Conjunctivae normal.     Pupils: Pupils are equal, round, and reactive to light.  Neck:     Thyroid: No thyromegaly.     Vascular: No JVD.     Trachea: No tracheal deviation.  Cardiovascular:     Rate and Rhythm: Normal rate and regular rhythm.     Heart sounds: Normal heart sounds. No murmur heard.  No friction rub. No gallop.   Pulmonary:     Effort: Pulmonary effort is normal. No respiratory distress.     Breath sounds: Normal breath sounds. No wheezing or rales.  Chest:     Chest wall: No tenderness.  Abdominal:     General: Bowel sounds are normal. There is no distension.     Palpations: Abdomen is soft. There is no mass.     Tenderness: There is no abdominal tenderness. There is no guarding or rebound.  Genitourinary:    Penis: Normal. No tenderness.      Testes: Normal.     Prostate: Normal.     Rectum: Normal. Guaiac result negative.  Musculoskeletal:        General: No tenderness. Normal range of motion.     Cervical back: Neck supple.    Lymphadenopathy:     Cervical: No cervical adenopathy.  Skin:    General: Skin is warm and dry.     Coloration: Skin is not pale.     Findings: No erythema or rash.  Neurological:     Mental Status: He is alert and oriented to person, place, and time.     Cranial Nerves: No cranial nerve deficit.     Motor: No abnormal muscle tone.     Coordination: Coordination normal.     Deep Tendon Reflexes: Reflexes are normal and symmetric. Reflexes normal.  Psychiatric:        Behavior: Behavior normal.        Thought Content: Thought content normal.        Judgment: Judgment normal.           Assessment & Plan:  Well exam. We discussed diet and exercise. Get fasting labs.  Alysia Penna, MD

## 2019-12-04 LAB — CBC WITH DIFFERENTIAL/PLATELET
Absolute Monocytes: 424 cells/uL (ref 200–950)
Basophils Absolute: 20 cells/uL (ref 0–200)
Basophils Relative: 0.5 %
Eosinophils Absolute: 52 cells/uL (ref 15–500)
Eosinophils Relative: 1.3 %
HCT: 43.6 % (ref 38.5–50.0)
Hemoglobin: 14.1 g/dL (ref 13.2–17.1)
Lymphs Abs: 1308 cells/uL (ref 850–3900)
MCH: 28.9 pg (ref 27.0–33.0)
MCHC: 32.3 g/dL (ref 32.0–36.0)
MCV: 89.3 fL (ref 80.0–100.0)
MPV: 11.2 fL (ref 7.5–12.5)
Monocytes Relative: 10.6 %
Neutro Abs: 2196 cells/uL (ref 1500–7800)
Neutrophils Relative %: 54.9 %
Platelets: 132 10*3/uL — ABNORMAL LOW (ref 140–400)
RBC: 4.88 10*6/uL (ref 4.20–5.80)
RDW: 14.5 % (ref 11.0–15.0)
Total Lymphocyte: 32.7 %
WBC: 4 10*3/uL (ref 3.8–10.8)

## 2019-12-04 LAB — BASIC METABOLIC PANEL
BUN: 19 mg/dL (ref 7–25)
CO2: 27 mmol/L (ref 20–32)
Calcium: 8.8 mg/dL (ref 8.6–10.3)
Chloride: 105 mmol/L (ref 98–110)
Creat: 0.93 mg/dL (ref 0.60–1.35)
Glucose, Bld: 92 mg/dL (ref 65–99)
Potassium: 3.9 mmol/L (ref 3.5–5.3)
Sodium: 140 mmol/L (ref 135–146)

## 2019-12-04 LAB — LIPID PANEL
Cholesterol: 138 mg/dL (ref <200)
HDL: 51 mg/dL (ref >=40)
LDL Cholesterol (Calc): 73 mg/dL (calc)
Non-HDL Cholesterol (Calc): 87 mg/dL (calc) (ref <130)
Total CHOL/HDL Ratio: 2.7 (calc) (ref <5.0)
Triglycerides: 49 mg/dL (ref <150)

## 2019-12-04 LAB — HEPATIC FUNCTION PANEL
AG Ratio: 1.3 (calc) (ref 1.0–2.5)
ALT: 26 U/L (ref 9–46)
AST: 22 U/L (ref 10–40)
Albumin: 4.3 g/dL (ref 3.6–5.1)
Alkaline phosphatase (APISO): 57 U/L (ref 36–130)
Bilirubin, Direct: 0.2 mg/dL (ref 0.0–0.2)
Globulin: 3.3 g/dL (calc) (ref 1.9–3.7)
Indirect Bilirubin: 0.5 mg/dL (calc) (ref 0.2–1.2)
Total Bilirubin: 0.7 mg/dL (ref 0.2–1.2)
Total Protein: 7.6 g/dL (ref 6.1–8.1)

## 2019-12-04 LAB — PSA: PSA: 2.17 ng/mL (ref <=4.0)

## 2019-12-04 LAB — TSH: TSH: 0.99 mIU/L (ref 0.40–4.50)

## 2019-12-21 ENCOUNTER — Encounter: Payer: Self-pay | Admitting: Family Medicine

## 2019-12-21 ENCOUNTER — Other Ambulatory Visit: Payer: Self-pay

## 2019-12-21 ENCOUNTER — Ambulatory Visit (INDEPENDENT_AMBULATORY_CARE_PROVIDER_SITE_OTHER): Payer: 59 | Admitting: Family Medicine

## 2019-12-21 VITALS — BP 148/82 | HR 55 | Temp 98.8°F | Ht 73.0 in | Wt 300.4 lb

## 2019-12-21 DIAGNOSIS — I10 Essential (primary) hypertension: Secondary | ICD-10-CM | POA: Diagnosis not present

## 2019-12-21 MED ORDER — METOPROLOL SUCCINATE ER 50 MG PO TB24: 100.0000 mg | ORAL_TABLET | Freq: Every day | ORAL | 3 refills | Status: DC

## 2019-12-21 NOTE — Progress Notes (Signed)
   Subjective:    Patient ID: Reginald Simmons, male    DOB: [birth year], 49 y.o.   MRN: [id number]  HPI Here for high BP readings this past week. He has been seeing Occupational Med for an injury to his left hand, and his BP has been in the 446K to 863O systolic. He feels fine otherwise.    Review of Systems  Constitutional: Negative.   Respiratory: Negative.   Cardiovascular: Negative.   Neurological: Negative.        Objective:   Physical Exam Constitutional:      Appearance: Normal appearance.  Cardiovascular:     Rate and Rhythm: Normal rate and regular rhythm.     Pulses: Normal pulses.     Heart sounds: Normal heart sounds.  Pulmonary:     Effort: Pulmonary effort is normal.     Breath sounds: Normal breath sounds.  Neurological:     General: No focal deficit present.     Mental Status: He is alert and oriented to person, place, and time.           Assessment & Plan:  HTN, we will increase the Metoprolol to 100 mg daily. He will report back to [place] in 2 weeks . Alysia Penna, MD

## 2019-12-27 ENCOUNTER — Telehealth: Payer: Self-pay

## 2019-12-27 NOTE — Telephone Encounter (Signed)
Patient called and advised of Dr. Barbie Banner recommendation, virtual appointment scheduled for tomorrow at 1015.

## 2019-12-27 NOTE — Telephone Encounter (Signed)
Patient's son Reginald Simmons had a virtual with you on 12/17/19. Please advise if patient needs to come in or not.

## 2019-12-27 NOTE — Telephone Encounter (Signed)
Patient called in stating that his son was treated last week for strept throat. Patient believes he may have gotten it from son over the weekend. Wanting to know if he can be seen in the office today or a virtual visit    Please advise

## 2019-12-27 NOTE — Telephone Encounter (Signed)
We are full today, but set up a virtual OV tomorrow

## 2019-12-28 ENCOUNTER — Encounter: Payer: Self-pay | Admitting: Family Medicine

## 2019-12-28 ENCOUNTER — Telehealth (INDEPENDENT_AMBULATORY_CARE_PROVIDER_SITE_OTHER): Payer: 59 | Admitting: Family Medicine

## 2019-12-28 VITALS — Temp 98.1°F

## 2019-12-28 DIAGNOSIS — J069 Acute upper respiratory infection, unspecified: Secondary | ICD-10-CM | POA: Diagnosis not present

## 2019-12-28 NOTE — Progress Notes (Signed)
   Subjective:    Patient ID: Reginald Simmons, male    DOB: 21-Apr-1970, 49 y.o.   MRN: [id number]  HPI Virtual Visit via Video Note  I connected with the patient on 12/28/19 at 10:15 AM EDT by a video enabled telemedicine application and verified that I am speaking with the correct person using two identifiers.  Location patient: home Location provider:work or home office Persons participating in the virtual visit: patient, provider  I discussed the limitations of evaluation and management by telemedicine and the availability of in person appointments. The patient expressed understanding and agreed to proceed.   HPI: Here for 5 days of stuffy head, ST, and coughing up clear sputum. No fever or body aches. No SOB or NVD. He has been drinking fluids and taking Coricidin. He feels much better today. 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    ROS: See pertinent positives and negatives per HPI.  Past Medical History:  Diagnosis Date  . Allergy   . GERD (gastroesophageal reflux disease)   . Hyperlipidemia   . Onychomycosis     History reviewed. No pertinent surgical history.  Family History  Problem Relation Age of Onset  . Hypertension Other   . Stroke Other   . Heart disease Other      Current Outpatient Medications:  .  cetirizine (ZYRTEC) 10 MG tablet, Take 10 mg by mouth daily. , Disp: , Rfl:  .  metoprolol succinate (TOPROL-XL) 50 MG 24 hr tablet, Take 2 tablets (100 mg total) by mouth daily. Take with or immediately following a meal., Disp: 90 tablet, Rfl: 3 .  polyvinyl alcohol (LIQUIFILM TEARS) 1.4 % ophthalmic solution, Place 1 drop into both eyes as needed for dry eyes., Disp: , Rfl:  .  rosuvastatin (CRESTOR) 40 MG tablet, Take 1 tablet (40 mg total) by mouth daily., Disp: 90 tablet, Rfl: 3  EXAM:  VITALS per patient if applicable:  GENERAL: alert, oriented, appears well and in no acute distress  HEENT: atraumatic, conjunttiva  clear, no obvious abnormalities on inspection of external nose and ears  NECK: normal movements of the head and neck  LUNGS: on inspection no signs of respiratory distress, breathing rate appears normal, no obvious gross SOB, gasping or wheezing  CV: no obvious cyanosis  MS: moves all visible extremities without noticeable abnormality  PSYCH/NEURO: pleasant and cooperative, no obvious depression or anxiety, speech and thought processing grossly intact  ASSESSMENT AND PLAN: Viral URI. He is written out of work for yesterday and today. Recheck as needed.  Alysia Penna, MD  Discussed the following assessment and plan:  No diagnosis found.     I discussed the assessment and treatment plan with the patient. The patient was provided an opportunity to ask questions and all were answered. The patient agreed with the plan and demonstrated an understanding of the instructions.   The patient was advised to call back or seek an in-person evaluation if the symptoms worsen or if the condition fails to improve as anticipated.     Review of Systems     Objective:   Physical Exam        Assessment & Plan:

## 2020-02-29 ENCOUNTER — Emergency Department (HOSPITAL_COMMUNITY)
Admission: EM | Admit: 2020-02-29 | Discharge: 2020-03-01 | Disposition: A | Discharge status: Home / Self Care | Payer: 59 | Attending: Emergency Medicine | Admitting: Emergency Medicine

## 2020-02-29 ENCOUNTER — Emergency Department (HOSPITAL_COMMUNITY): Payer: 59

## 2020-02-29 DIAGNOSIS — Z8616 Personal history of COVID-19: Secondary | ICD-10-CM | POA: Insufficient documentation

## 2020-02-29 DIAGNOSIS — I10 Essential (primary) hypertension: Secondary | ICD-10-CM | POA: Diagnosis not present

## 2020-02-29 DIAGNOSIS — Z87891 Personal history of nicotine dependence: Secondary | ICD-10-CM | POA: Diagnosis not present

## 2020-02-29 DIAGNOSIS — R519 Headache, unspecified: Secondary | ICD-10-CM | POA: Diagnosis present

## 2020-02-29 DIAGNOSIS — Z79899 Other long term (current) drug therapy: Secondary | ICD-10-CM | POA: Diagnosis not present

## 2020-02-29 DIAGNOSIS — R0789 Other chest pain: Secondary | ICD-10-CM

## 2020-02-29 LAB — CBC WITH DIFFERENTIAL/PLATELET
Abs Immature Granulocytes: 0.01 10*3/uL (ref 0.00–0.07)
Basophils Absolute: 0 10*3/uL (ref 0.0–0.1)
Basophils Relative: 0 %
Eosinophils Absolute: 0.1 10*3/uL (ref 0.0–0.5)
Eosinophils Relative: 1 %
HCT: 42.2 % (ref 39.0–52.0)
Hemoglobin: 13.7 g/dL (ref 13.0–17.0)
Immature Granulocytes: 0 %
Lymphocytes Relative: 24 %
Lymphs Abs: 1.2 10*3/uL (ref 0.7–4.0)
MCH: 29 pg (ref 26.0–34.0)
MCHC: 32.5 g/dL (ref 30.0–36.0)
MCV: 89.4 fL (ref 80.0–100.0)
Monocytes Absolute: 0.5 10*3/uL (ref 0.1–1.0)
Monocytes Relative: 10 %
Neutro Abs: 3.2 10*3/uL (ref 1.7–7.7)
Neutrophils Relative %: 65 %
Platelets: 99 10*3/uL — ABNORMAL LOW (ref 150–400)
RBC: 4.72 MIL/uL (ref 4.22–5.81)
RDW: 14.6 % (ref 11.5–15.5)
WBC: 5.1 10*3/uL (ref 4.0–10.5)
nRBC: 0 % (ref 0.0–0.2)

## 2020-02-29 LAB — BASIC METABOLIC PANEL
Anion gap: 9 (ref 5–15)
BUN: 16 mg/dL (ref 6–20)
CO2: 23 mmol/L (ref 22–32)
Calcium: 8.4 mg/dL — ABNORMAL LOW (ref 8.9–10.3)
Chloride: 105 mmol/L (ref 98–111)
Creatinine, Ser: 1.04 mg/dL (ref 0.61–1.24)
GFR, Estimated: >60 mL/min (ref >60)
Glucose, Bld: 100 mg/dL — ABNORMAL HIGH (ref 70–99)
Potassium: 3.5 mmol/L (ref 3.5–5.1)
Sodium: 137 mmol/L (ref 135–145)

## 2020-02-29 LAB — I-STAT CHEM 8, ED
BUN: 20 mg/dL (ref 6–20)
Calcium, Ion: 1.14 mmol/L — ABNORMAL LOW (ref 1.15–1.40)
Chloride: 103 mmol/L (ref 98–111)
Creatinine, Ser: 0.8 mg/dL (ref 0.61–1.24)
Glucose, Bld: 98 mg/dL (ref 70–99)
HCT: 42 % (ref 39.0–52.0)
Hemoglobin: 14.3 g/dL (ref 13.0–17.0)
Potassium: 3.6 mmol/L (ref 3.5–5.1)
Sodium: 141 mmol/L (ref 135–145)
TCO2: 27 mmol/L (ref 22–32)

## 2020-02-29 LAB — TROPONIN I (HIGH SENSITIVITY): Troponin I (High Sensitivity): 5 ng/L (ref <18)

## 2020-02-29 MED ORDER — ACETAMINOPHEN 500 MG PO TABS
1000.0000 mg | ORAL_TABLET | Freq: Once | ORAL | Status: AC
  Administered 2020-02-29: 1000 mg via ORAL
  Filled 2020-02-29: qty 2

## 2020-02-29 NOTE — ED Provider Notes (Signed)
[facility] EMERGENCY DEPARTMENT Provider Note   CSN: [id number] Arrival date & time: 02/29/20  2058     History Chief Complaint  Patient presents with  . Hypertension    Reginald Simmons is a 49 y.o. male.  Patient is a 49 year old male with a history of hypertension hyperlipidemia who presents with elevated blood pressure and some chest discomfort.  He states that he has not felt right for the last couple days.  He has had some intermittent headaches and lightheadedness which he says that he normally gets when his blood pressure is up.  He says he has had some very mild discomfort in his left chest intermittently.  It is nonexertional.  Not related to eating or movement.  Nonradiating.  He has no associated symptoms.  No shortness of breath.  No leg swelling.  He says his blood pressure was elevated today to 180/98 and his headache had gotten worse so he decided to come in.  He said his headache is premeds resolved at this point and he is currently asymptomatic.        Past Medical History:  Diagnosis Date  . Allergy   . GERD (gastroesophageal reflux disease)   . Hyperlipidemia   . Onychomycosis     Patient Active Problem List   Diagnosis Date Noted  . Essential hypertension 04/30/2019  . Morbid (severe) obesity due to excess calories (HCC) 11/13/2018  . COVID-19 virus infection 09/29/2018  . Thrombocytopenia (HCC) 12/01/2017  . Obesity (BMI 30-39.9) 12/01/2017  . Neck strain 06/22/2015  . Hyperlipidemia 05/24/2008  . KELOID SCAR 05/24/2008  . ALLERGIC RHINITIS 12/19/2006  . GERD 11/04/2006    No past surgical history on file.     Family History  Problem Relation Age of Onset  . Hypertension Other   . Stroke Other   . Heart disease Other     Social History   Tobacco Use  . Smoking status: Former Smoker    Packs/day: 0.25    Years: 2.00    Pack years: 0.50    Types: Cigarettes    Quit date: 12/03/1994    Years since quitting: 25.2  .  Smokeless tobacco: Never Used  Vaping Use  . Vaping Use: Never used  Substance Use Topics  . Alcohol use: No    Alcohol/week: 0.0 standard drinks  . Drug use: No    Home Medications Prior to Admission medications   Medication Sig Start Date End Date Taking? Authorizing Provider  cetirizine (ZYRTEC) 10 MG tablet Take 10 mg by mouth daily.     [provider]  metoprolol succinate (TOPROL-XL) 50 MG 24 hr tablet Take 2 tablets (100 mg total) by mouth daily. Take with or immediately following a meal. 12/21/19   Nelwyn Salisbury, MD  polyvinyl alcohol (LIQUIFILM TEARS) 1.4 % ophthalmic solution Place 1 drop into both eyes as needed for dry eyes.    [provider]  rosuvastatin (CRESTOR) 40 MG tablet Take 1 tablet (40 mg total) by mouth daily. 12/03/19   Nelwyn Salisbury, MD    Allergies    Patient has no known allergies.  Review of Systems   Review of Systems  Constitutional: Negative for chills, diaphoresis, fatigue and fever.  HENT: Negative for congestion, rhinorrhea and sneezing.   Eyes: Negative.   Respiratory: Positive for chest tightness. Negative for cough and shortness of breath.   Cardiovascular: Negative for chest pain and leg swelling.  Gastrointestinal: Negative for abdominal pain, blood in  stool, diarrhea, nausea and vomiting.  Genitourinary: Negative for difficulty urinating, flank pain, frequency and hematuria.  Musculoskeletal: Negative for arthralgias and back pain.  Skin: Negative for rash.  Neurological: Positive for light-headedness and headaches. Negative for dizziness, speech difficulty, weakness and numbness.    Physical Exam Updated Vital Signs BP (!) 166/93 (BP Location: Right Arm)   Pulse 60   Temp 99.1 F (37.3 C) (Oral)   Resp 18   Ht 6\' 1"  (1.854 m)   Wt 131.5 kg   SpO2 99%   BMI 38.26 kg/m   Physical Exam Constitutional:      Appearance: He is well-developed and well-nourished.  HENT:     Head: Normocephalic and atraumatic.   Eyes:     Pupils: Pupils are equal, round, and reactive to light.  Cardiovascular:     Rate and Rhythm: Normal rate and regular rhythm.     Heart sounds: Normal heart sounds.  Pulmonary:     Effort: Pulmonary effort is normal. No respiratory distress.     Breath sounds: Normal breath sounds. No wheezing or rales.  Chest:     Chest wall: No tenderness.  Abdominal:     General: Bowel sounds are normal.     Palpations: Abdomen is soft.     Tenderness: There is no abdominal tenderness. There is no guarding or rebound.  Musculoskeletal:        General: No edema. Normal range of motion.     Cervical back: Normal range of motion and neck supple.  Lymphadenopathy:     Cervical: No cervical adenopathy.  Skin:    General: Skin is warm and dry.     Findings: No rash.  Neurological:     General: No focal deficit present.     Mental Status: He is alert and oriented to person, place, and time.  Psychiatric:        Mood and Affect: Mood and affect normal.     ED Results / Procedures / Treatments   Labs (all labs ordered are listed, but only abnormal results are displayed) Labs Reviewed  BASIC METABOLIC PANEL - Abnormal; Notable for the following components:      Result Value   Glucose, Bld 100 (*)    Calcium 8.4 (*)    All other components within normal limits  CBC WITH DIFFERENTIAL/PLATELET - Abnormal; Notable for the following components:   Platelets 99 (*)    All other components within normal limits  I-STAT CHEM 8, ED - Abnormal; Notable for the following components:   Calcium, Ion 1.14 (*)    All other components within normal limits  TROPONIN I (HIGH SENSITIVITY)  TROPONIN I (HIGH SENSITIVITY)    EKG EKG Interpretation  Date/Time:  Tuesday February 29 2020 22:29:30 EST Ventricular Rate:  49 PR Interval:  228 QRS Duration: 90 QT Interval:  448 QTC Calculation: 404 R Axis:   33 Text Interpretation: Sinus bradycardia with 1st degree A-V block Otherwise normal ECG  simlar to EKG from Jun 2001, PR is slighlty more prolonged Confirmed by Rolan Bucco [phone]) on 02/29/2020 10:55:11 PM   Radiology DG Chest 2 View  Result Date: 02/29/2020 CLINICAL DATA:  Chest pain. Hypertensive. Headache and lightheadedness. EXAM: CHEST - 2 VIEW COMPARISON:  09/27/2018 FINDINGS: Upper normal heart size.The cardiomediastinal contours are normal. Redemonstrated calcified granuloma in the left lung. Pulmonary vasculature is normal. No consolidation, pleural effusion, or pneumothorax. No acute osseous abnormalities are seen. IMPRESSION: No acute chest findings. Electronically Signed  By: Keith Rake M.D.   On: 02/29/2020 21:49    Procedures Procedures (including critical care time)  Medications Ordered in ED Medications  acetaminophen (TYLENOL) tablet 1,000 mg (has no administration in time range)    ED Course  I have reviewed the triage vital signs and the nursing notes.  Pertinent labs & imaging results that were available during my care of the patient were reviewed by me and considered in my medical decision making (see chart for details).    MDM Rules/Calculators/A&P                          Patient is a 49 year old male who presents with some mild headache lightheadedness and chest pressure in association with elevated blood pressures.  He is feeling better now.  He currently denies any symptoms other than a mild headache.  He has no neurologic deficits.  No evidence of endorgan damage.  His EKG does not show any ischemic changes.  His first troponin is negative.  His chest x-ray is clear without evidence of pneumothorax or pneumonia.  No pulmonary edema.  He does not have any exertional chest pain or associated symptoms such as shortness of breath.  His blood pressure has improved without treatment.  I feel that if his second troponin is normal, he can be discharged home with follow-up with his PCP regarding his blood pressure.  Dr. Leonette Monarch to take over care  pending second troponin. Final Clinical Impression(s) / ED Diagnoses Final diagnoses:  Hypertension, unspecified type  Atypical chest pain    Rx / DC Orders ED Discharge Orders    None       Malvin Johns, MD 02/29/20 2321

## 2020-02-29 NOTE — ED Triage Notes (Signed)
Pt called EMS for headache and lightheadedness. Pt noted to be hypertensive with hx of the same; had medication changes a few months ago. Initial bp 180/98, decreased enroute to 152/78. CBG 191

## 2020-03-01 ENCOUNTER — Other Ambulatory Visit: Payer: Self-pay

## 2020-03-01 LAB — TROPONIN I (HIGH SENSITIVITY): Troponin I (High Sensitivity): 6 ng/L (ref <18)

## 2020-03-24 ENCOUNTER — Encounter: Payer: Self-pay | Admitting: Family Medicine

## 2020-03-24 ENCOUNTER — Ambulatory Visit: Payer: Managed Care, Other (non HMO) | Admitting: Family Medicine

## 2020-03-24 ENCOUNTER — Other Ambulatory Visit: Payer: Self-pay

## 2020-03-24 VITALS — BP 132/72 | HR 57 | Ht 73.0 in | Wt 299.0 lb

## 2020-03-24 DIAGNOSIS — R079 Chest pain, unspecified: Secondary | ICD-10-CM

## 2020-03-24 NOTE — Progress Notes (Signed)
Established Patient Office Visit  Subjective:  Patient ID: Reginald Simmons, male    DOB: 1970-11-21  Age: 50 y.o. MRN: [id number]  CC:  Chief Complaint  Patient presents with  . Chest Pain    HPI AN SCHNABEL presents for episode yesterday of some left sided chest heaviness.  He apparently had similar symptoms back December 28 which prompted ER visit.  He had elevated blood pressure at that time.  Troponins were negative.  EKG no acute changes.  He denies any history of exertional chest pain.  Symptoms yesterday were first noted when he was in a meeting at rest.  Possibly slightly worse with movement such as bending to the left.  No radiation.  No dyspnea.  No nausea or vomiting.  Symptoms lasted for apparently several hours.  Patient had stress test 2019 with hypertensive blood pressure response but no acute changes.  He recently has done some weightlifting but no aerobic exercise.  Occasionally has twinges of left chest pain with lifting but not consistently.  His mother apparently had coronary disease in her 19s.  Father had a stroke around age 88.  He has 4 siblings and none of them have heart disease.  No history of smoking.  No diabetes history.  Patient currently takes metoprolol for hypertension.  Past Medical History:  Diagnosis Date  . Allergy   . GERD (gastroesophageal reflux disease)   . Hyperlipidemia   . Onychomycosis     No past surgical history on file.  Family History  Problem Relation Age of Onset  . Hypertension Other   . Stroke Other   . Heart disease Other     Social History   Socioeconomic History  . Marital status: Divorced    Spouse name: Not on file  . Number of children: Not on file  . Years of education: Not on file  . Highest education level: Not on file  Occupational History  . Not on file  Tobacco Use  . Smoking status: Former Smoker    Packs/day: 0.25    Years: 2.00    Pack years: 0.50    Types: Cigarettes    Quit date: 12/03/1994     Years since quitting: 25.3  . Smokeless tobacco: Never Used  Vaping Use  . Vaping Use: Never used  Substance and Sexual Activity  . Alcohol use: No    Alcohol/week: 0.0 standard drinks  . Drug use: No  . Sexual activity: Not on file  Other Topics Concern  . Not on file  Social History Narrative  . Not on file   Social Determinants of Health   Financial Resource Strain: Not on file  Food Insecurity: Not on file  Transportation Needs: Not on file  Physical Activity: Not on file  Stress: Not on file  Social Connections: Not on file  Intimate Partner Violence: Not on file    Outpatient Medications Prior to Visit  Medication Sig Dispense Refill  . cetirizine (ZYRTEC) 10 MG tablet Take 10 mg by mouth daily.    . metoprolol succinate (TOPROL-XL) 50 MG 24 hr tablet Take 2 tablets (100 mg total) by mouth daily. Take with or immediately following a meal. 90 tablet 3  . polyvinyl alcohol (LIQUIFILM TEARS) 1.4 % ophthalmic solution Place 1 drop into both eyes as needed for dry eyes.    . rosuvastatin (CRESTOR) 40 MG tablet Take 1 tablet (40 mg total) by mouth daily. 90 tablet 3   No facility-administered medications prior to  visit.    No Known Allergies  ROS Review of Systems  Constitutional: Negative for chills and fever.  Respiratory: Negative for cough and shortness of breath.   Cardiovascular: Positive for chest pain. Negative for leg swelling.  Gastrointestinal: Negative for abdominal pain.  Genitourinary: Negative for dysuria.  Hematological: Negative for adenopathy.      Objective:    Physical Exam Vitals reviewed.  Constitutional:      Appearance: He is well-developed.  Cardiovascular:     Rate and Rhythm: Normal rate and regular rhythm.     Heart sounds: Normal heart sounds.  Pulmonary:     Effort: Pulmonary effort is normal. No respiratory distress.     Breath sounds: Normal breath sounds.  Musculoskeletal:     Cervical back: Neck supple.     Right lower  leg: No edema.     Left lower leg: No edema.  Neurological:     General: No focal deficit present.     Mental Status: He is alert.     Cranial Nerves: No cranial nerve deficit.     BP 132/72 (BP Location: Left Arm, Cuff Size: Large)   Pulse (!) 57   Ht 6\' 1"  (1.854 m)   Wt 299 lb (135.6 kg)   SpO2 95%   BMI 39.45 kg/m  Wt Readings from Last 3 Encounters:  03/24/20 299 lb (135.6 kg)  02/29/20 290 lb (131.5 kg)  12/21/19 (!) 300 lb 6.4 oz (136.3 kg)     Health Maintenance Due  Topic Date Due  . Hepatitis C Screening  Never done  . COLONOSCOPY (Pts 45-98yrs Insurance coverage will need to be confirmed)  Never done    There are no preventive care reminders to display for this patient.  Lab Results  Component Value Date   TSH 0.99 12/03/2019   Lab Results  Component Value Date   WBC 5.1 02/29/2020   HGB 14.3 02/29/2020   HCT 42.0 02/29/2020   MCV 89.4 02/29/2020   PLT 99 (L) 02/29/2020   Lab Results  Component Value Date   NA 141 02/29/2020   K 3.6 02/29/2020   CO2 23 02/29/2020   GLUCOSE 98 02/29/2020   BUN 20 02/29/2020   CREATININE 0.80 02/29/2020   BILITOT 0.7 12/03/2019   ALKPHOS 57 09/27/2018   AST 22 12/03/2019   ALT 26 12/03/2019   PROT 7.6 12/03/2019   ALBUMIN 4.1 09/27/2018   CALCIUM 8.4 (L) 02/29/2020   ANIONGAP 9 02/29/2020   GFR 115.52 11/07/2017   Lab Results  Component Value Date   CHOL 138 12/03/2019   Lab Results  Component Value Date   HDL 51 12/03/2019   Lab Results  Component Value Date   LDLCALC 73 12/03/2019   Lab Results  Component Value Date   TRIG 49 12/03/2019   Lab Results  Component Value Date   CHOLHDL 2.7 12/03/2019   Lab Results  Component Value Date   HGBA1C 6.2 11/13/2018      Assessment & Plan:   Problem List Items Addressed This Visit   None   Visit Diagnoses    Chest pain, unspecified type    -  Primary   Relevant Orders   EKG 12-Lead    Chest pain symptoms are somewhat atypical.  No clear  exertional history.  Positive FH premature CAD (mother) Obtain EKG=NSR with no acute changes. Doubt cardiac but with recurrent symptoms will set up cardiology referral for further evaluation.  No orders of the defined  types were placed in this encounter.   Follow-up: No follow-ups on file.    Carolann Littler, MD

## 2020-03-24 NOTE — Patient Instructions (Signed)

## 2020-04-01 NOTE — Progress Notes (Deleted)
Cardiology Office Note   Date:  04/01/2020   ID:  Leverett, Camplin [birth year], MRN [id number]  PCP:  Laurey Morale, MD    No chief complaint on file.    Wt Readings from Last 3 Encounters:  03/24/20 299 lb (135.6 kg)  02/29/20 290 lb (131.5 kg)  12/21/19 (!) 300 lb 6.4 oz (136.3 kg)       History of Present Illness: Reginald Simmons is a 50 y.o. male  Who I first saw for palpitations in 2020 at the request of Laurey Morale, MD.  His mother apparently had coronary disease in her 37s.  Father had a stroke around age 51.  He has 4 siblings and none of them have heart disease.  He had COVID in July 2020.  He had diarrhea, chills, fever, body aches, cough, congestion.  No shortness of breath.  He was able to stay home.  Before COVID, he was lifting weights.   He had some palpitations about 1 months ago.  He had some lightheadedness.  He called EMS.    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  He walked 20K steps a day.    Seen by PMD for chest pain: "Chest pain symptoms are somewhat atypical.  No clear exertional history.  Positive FH premature CAD (mother) Obtain EKG=NSR with no acute changes. Doubt cardiac but with recurrent symptoms will set up cardiology referral for further evaluation."    Past Medical History:  Diagnosis Date  . Allergy   . GERD (gastroesophageal reflux disease)   . Hyperlipidemia   . Onychomycosis     No past surgical history on file.   Current Outpatient Medications  Medication Sig Dispense Refill  . cetirizine (ZYRTEC) 10 MG tablet Take 10 mg by mouth daily.    . metoprolol succinate (TOPROL-XL) 50 MG 24 hr tablet Take 2 tablets (100 mg total) by mouth daily. Take with or immediately following a meal. 90 tablet 3  . polyvinyl alcohol (LIQUIFILM TEARS) 1.4 % ophthalmic solution Place 1 drop into both eyes as needed for dry eyes.    . rosuvastatin (CRESTOR) 40  MG tablet Take 1 tablet (40 mg total) by mouth daily. 90 tablet 3   No current facility-administered medications for this visit.    Allergies:   Patient has no known allergies.    Social History:  The patient  reports that he quit smoking about 25 years ago. His smoking use included cigarettes. He has a 0.50 pack-year smoking history. He has never used smokeless tobacco. He reports that he does not drink alcohol and does not use drugs.   Family History:  The patient's ***family history includes Heart disease in an other family member; Hypertension in an other family member; Stroke in an other family member.    ROS:  Please see the history of present illness.   Otherwise, review of systems are positive for ***.   All other systems are reviewed and negative.    PHYSICAL EXAM: VS:  There were no vitals taken for this visit. , BMI There is no height or weight on file to calculate BMI. GEN: Well nourished, well developed, in no acute distress  HEENT: normal  Neck: no JVD, carotid bruits, or masses Cardiac: ***RRR; no murmurs, rubs, or gallops,no edema  Respiratory:  clear to auscultation bilaterally, normal work of breathing GI: soft, nontender, nondistended, +  BS MS: no deformity or atrophy  Skin: warm and dry, no rash Neuro:  Strength and sensation are intact Psych: euthymic mood, full affect   EKG:   The ekg ordered today demonstrates ***   Recent Labs: 12/03/2019: ALT 26; TSH 0.99 02/29/2020: BUN 20; Creatinine, Ser 0.80; Hemoglobin 14.3; Platelets 99; Potassium 3.6; Sodium 141   Lipid Panel    Component Value Date/Time   CHOL 138 12/03/2019 1103   TRIG 49 12/03/2019 1103   TRIG 100 12/13/2005 1034   HDL 51 12/03/2019 1103   CHOLHDL 2.7 12/03/2019 1103   VLDL 17.8 08/10/2018 1017   LDLCALC 73 12/03/2019 1103   LDLDIRECT 201.4 09/18/2012 0957     Other studies Reviewed: Additional studies/ records that were reviewed today with results demonstrating:  ***.   ASSESSMENT AND PLAN:  1. Chest pain:  2. *** 3. ***   Current medicines are reviewed at length with the patient today.  The patient concerns regarding his medicines were addressed.  The following changes have been made:  No change***  Labs/ tests ordered today include: *** No orders of the defined types were placed in this encounter.   Recommend 150 minutes/week of aerobic exercise Low fat, low carb, high fiber diet recommended  Disposition:   FU in ***   Signed, Larae Grooms, MD  04/01/2020 11:30 PM    [facility] [place], [place], [place]  [address] Phone: [phone]; Fax: [phone]

## 2020-04-03 ENCOUNTER — Ambulatory Visit: Payer: Managed Care, Other (non HMO) | Admitting: Interventional Cardiology

## 2020-04-03 DIAGNOSIS — R002 Palpitations: Secondary | ICD-10-CM

## 2020-04-03 DIAGNOSIS — R072 Precordial pain: Secondary | ICD-10-CM

## 2020-04-03 DIAGNOSIS — Z8249 Family history of ischemic heart disease and other diseases of the circulatory system: Secondary | ICD-10-CM

## 2020-04-03 DIAGNOSIS — E782 Mixed hyperlipidemia: Secondary | ICD-10-CM

## 2020-04-07 IMAGING — DX PORTABLE CHEST - 1 VIEW
1 series · 1 of 1 positions shown · non-contrast
Comparison: 11/30/2017

CLINICAL DATA: Cough and fever.

EXAM:
PORTABLE CHEST 1 VIEW

[chest ap]
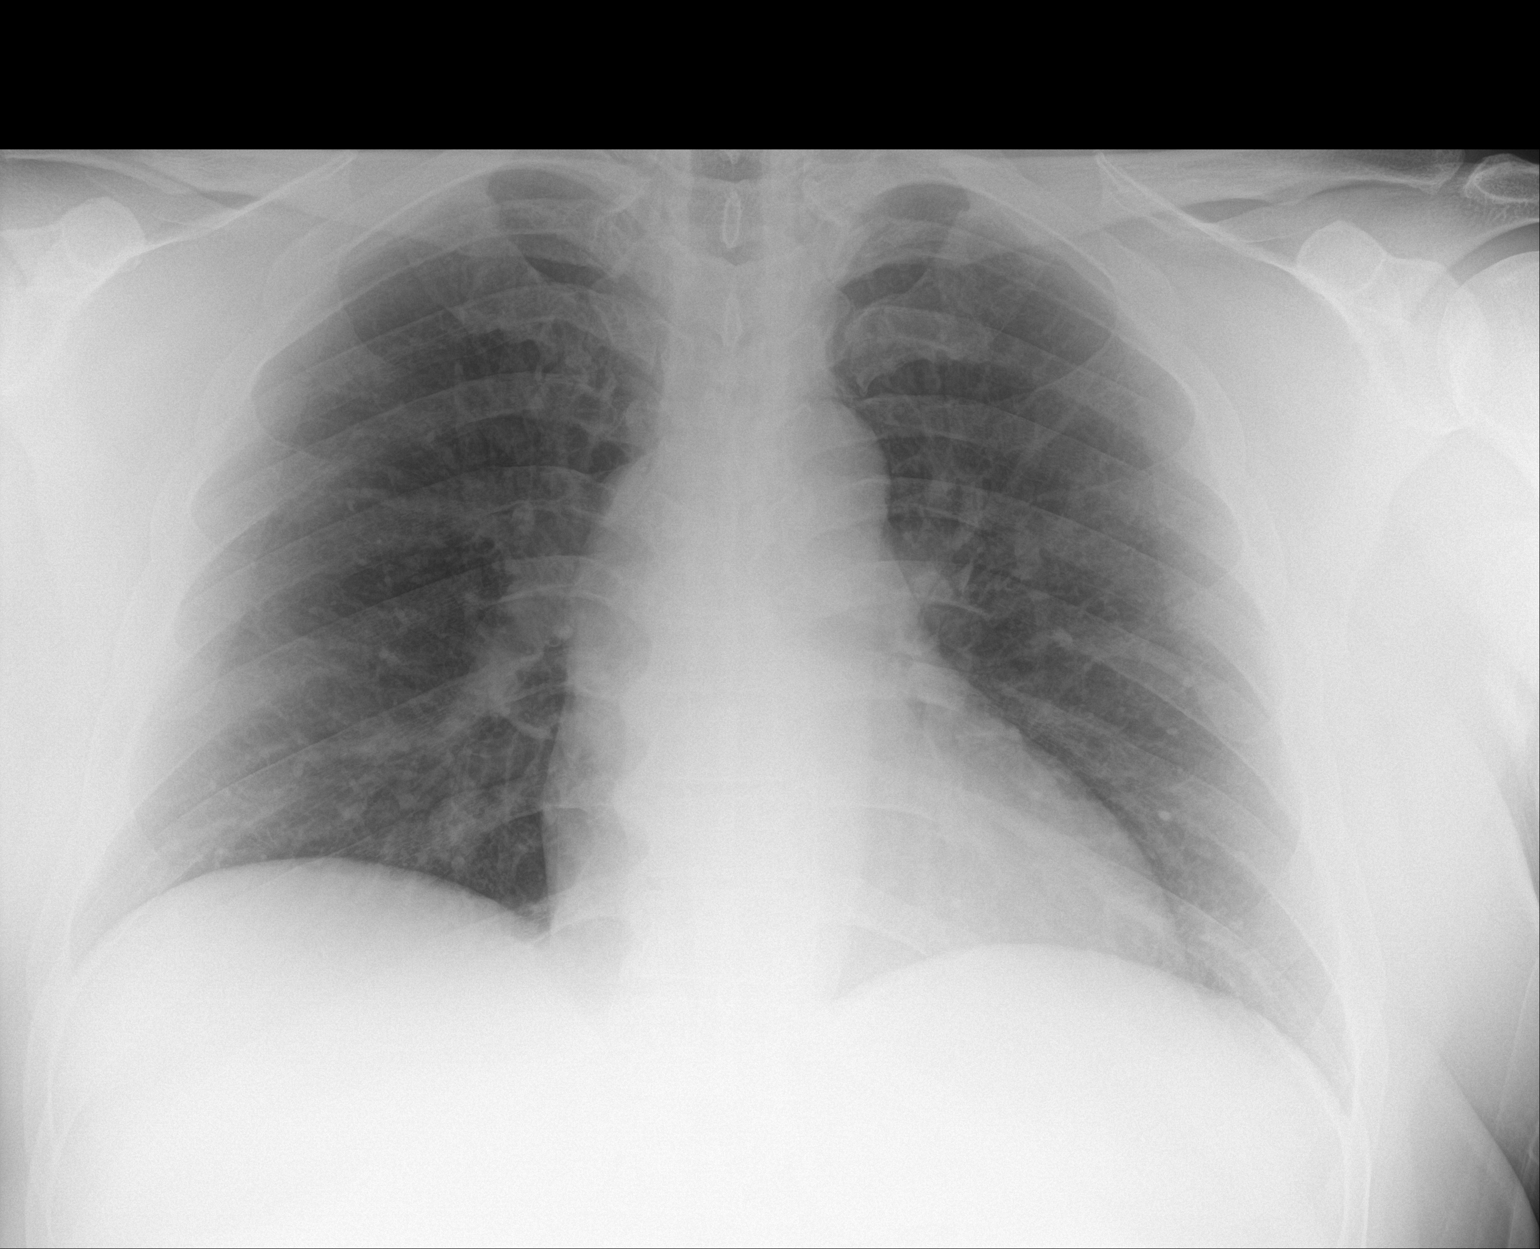

[1 of 1 positions shown; findings below may reference images not displayed]

FINDINGS: The heart size is mildly enlarged. There is no pneumothorax. No
large pleural effusion. There is no focal infiltrate. There is no
acute osseous abnormality.
IMPRESSION: 1. No acute cardiopulmonary process.
2. Mild cardiac enlargement.

## 2020-04-17 ENCOUNTER — Encounter: Payer: Self-pay | Admitting: General Practice

## 2020-04-28 ENCOUNTER — Other Ambulatory Visit: Payer: Self-pay | Admitting: Family Medicine

## 2020-05-15 NOTE — Progress Notes (Signed)
Cardiology Office Note   Date:  05/16/2020   ID:  Reginald Simmons [birth year], MRN [id number]  PCP:  Laurey Morale, MD    No chief complaint on file.  HTN  Wt Readings from Last 3 Encounters:  05/16/20 (!) 306 lb 9.6 oz (139.1 kg)  03/24/20 299 lb (135.6 kg)  02/29/20 290 lb (131.5 kg)       History of Present Illness: Reginald Simmons is a 50 y.o. male who I saw in 11/ 2020 for palpitations.  He had COVID in July 2020.  He had diarrhea, chills, fever, body aches, cough, congestion.  No shortness of breath.  He was able to stay home.    He had some palpitations about 1 months ago.  He had some lightheadedness.  He called EMS.    " His mother apparently had coronary disease in her 36s.  Father had a stroke around age 84.  He has 4 siblings and none of them have heart disease.  No history of smoking.  No diabetes history. "  He does a lot of walking at work.  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    Of late, he works out 4x/week at work.  Tries to eat healthy.   Had some atypical chest pain with certain chest movements, but this has resolved.        Past Medical History:  Diagnosis Date  . Allergy   . GERD (gastroesophageal reflux disease)   . Hyperlipidemia   . Onychomycosis     History reviewed. No pertinent surgical history.   Current Outpatient Medications  Medication Sig Dispense Refill  . cetirizine (ZYRTEC) 10 MG tablet Take 10 mg by mouth daily.    . metoprolol succinate (TOPROL-XL) 50 MG 24 hr tablet TAKE 1 TABLET BY MOUTH ONCE DAILY. TAKE WITH OR IMMEDIATELY FOLLOWING A MEAL. 90 tablet 1  . polyvinyl alcohol (LIQUIFILM TEARS) 1.4 % ophthalmic solution Place 1 drop into both eyes as needed for dry eyes.    . rosuvastatin (CRESTOR) 40 MG tablet Take 80 mg by mouth daily.     No current facility-administered medications for this visit.    Allergies:    Patient has no known allergies.    Social History:  The patient  reports that he quit smoking about 25 years ago. His smoking use included cigarettes. He has a 0.50 pack-year smoking history. He has never used smokeless tobacco. He reports that he does not drink alcohol and does not use drugs.   Family History:  The patient's family history includes Heart disease in an other family member; Hypertension in an other family member; Stroke in an other family member.    ROS:  Please see the history of present illness.   Otherwise, review of systems are positive for left heel numbness.   All other systems are reviewed and negative.    PHYSICAL EXAM: VS:  BP (!) 136/94   Pulse 74   Ht 6\' 1"  (1.854 m)   Wt (!) 306 lb 9.6 oz (139.1 kg)   SpO2 96%   BMI 40.45 kg/m  , BMI Body mass index is 40.45 kg/m. GEN: Well nourished, well developed, in no acute distress  HEENT: normal  Neck: no JVD, carotid bruits, or masses Cardiac: RRR; no murmurs, rubs, or gallops,no edema  Respiratory:  clear to auscultation bilaterally, normal work of  breathing GI: soft, nontender, nondistended, + BS MS: no deformity or atrophy  Skin: warm and dry, no rash Neuro:  Strength and sensation are intact Psych: euthymic mood, full affect   EKG:   The ekg ordered Jan 2022 demonstrates sinus bradycardia   Recent Labs: 12/03/2019: ALT 26; TSH 0.99 02/29/2020: BUN 20; Creatinine, Ser 0.80; Hemoglobin 14.3; Platelets 99; Potassium 3.6; Sodium 141   Lipid Panel    Component Value Date/Time   CHOL 138 12/03/2019 1103   TRIG 49 12/03/2019 1103   TRIG 100 12/13/2005 1034   HDL 51 12/03/2019 1103   CHOLHDL 2.7 12/03/2019 1103   VLDL 17.8 08/10/2018 1017   LDLCALC 73 12/03/2019 1103   LDLDIRECT 201.4 09/18/2012 0957     Other studies Reviewed: Additional studies/ records that were reviewed today with results demonstrating: labs reviewed.     ASSESSMENT AND PLAN:  1. Family history of coronary artery disease:  Preventive therapy recommended including healthy diet.  We discussed calcium scoring CT but since he had a negative treadmill a few years ago and is already on high-dose rosuvastatin, will hold off for now.  He is not having any symptoms of angina.  He exercises regularly.  We spoke about risk factor modification including weight loss, healthy diet and regular exercise. 2. Hyperlipidemia: LDL 73 in 2021.  Continue rosuvastatin.  Whole food, plant based diet.  3. HTN: Initial blood pressure was 134/94, but this came down to the above reading after sitting for a while.  Metoprolol XL 100 mg daily.  HR was 52 at last ECG.  Start lisinopril 10 mg daily, decrease metoprolol to 50 mg daily.  Wean metoprolol ideally off given his relatively young age. His Mom is on lisinopril.  May need lisinopril 20 mg.  F/u in HTN clinic and if BP ok, perhaps metoprolol can be stopped in a few weeks. 4. He had palpitations in the past.  Initially, he thought they were related to losartan but no longer think so.  This is why he is willing to try lisinopril in the setting of his bradycardia noted on ECG. 5. Morbid Obesity: Stable.     Current medicines are reviewed at length with the patient today.  The patient concerns regarding his medicines were addressed.  The following changes have been made:  No change  Labs/ tests ordered today include:  No orders of the defined types were placed in this encounter.   Recommend 150 minutes/week of aerobic exercise Low fat, low carb, high fiber diet recommended  Disposition:   FU in 1 year with me, 2 weeks in HTN clinic   Signed, Larae Grooms, MD  05/16/2020 4:07 PM    [facility] [place], [place], [place]  [address] Phone: [phone]; Fax: [phone]

## 2020-05-16 ENCOUNTER — Ambulatory Visit: Payer: Managed Care, Other (non HMO) | Admitting: Interventional Cardiology

## 2020-05-16 ENCOUNTER — Encounter: Payer: Self-pay | Admitting: Interventional Cardiology

## 2020-05-16 ENCOUNTER — Other Ambulatory Visit: Payer: Self-pay

## 2020-05-16 VITALS — BP 126/76 | HR 74 | Ht 73.0 in | Wt 306.6 lb

## 2020-05-16 DIAGNOSIS — Z8249 Family history of ischemic heart disease and other diseases of the circulatory system: Secondary | ICD-10-CM

## 2020-05-16 DIAGNOSIS — R002 Palpitations: Secondary | ICD-10-CM

## 2020-05-16 DIAGNOSIS — I10 Essential (primary) hypertension: Secondary | ICD-10-CM

## 2020-05-16 DIAGNOSIS — E782 Mixed hyperlipidemia: Secondary | ICD-10-CM

## 2020-05-16 MED ORDER — METOPROLOL SUCCINATE ER 50 MG PO TB24: 50.0000 mg | ORAL_TABLET | Freq: Every day | ORAL | 3 refills | Status: DC

## 2020-05-16 MED ORDER — LISINOPRIL 10 MG PO TABS: 10.0000 mg | ORAL_TABLET | Freq: Every day | ORAL | 3 refills | Status: DC

## 2020-05-16 NOTE — Patient Instructions (Signed)
Medication Instructions:  Your physician has recommended you make the following change in your medication: Decrease Metoprolol Succinate to 50 mg by mouth daily. Start lisinopril 10 mg by mouth daily  *If you need a refill on your cardiac medications before your next appointment, please call your pharmacy*   Lab Work: Lab work to be done on day of hypertension clinic--BMP If you have labs (blood work) drawn today and your tests are completely normal, you will receive your results only by: Marland Kitchen MyChart Message (if you have MyChart) OR . A paper copy in the mail If you have any lab test that is abnormal or we need to change your treatment, we will call you to review the results.   Testing/Procedures: none   Follow-Up: At [facility], you and your health needs are our priority.  As part of our continuing mission to provide you with exceptional heart care, we have created designated Provider Care Teams.  These Care Teams include your primary Cardiologist (physician) and Advanced Practice Providers (APPs -  Physician Assistants and Nurse Practitioners) who all work together to provide you with the care you need, when you need it.  We recommend signing up for the patient portal called "MyChart".  Sign up information is provided on this After Visit Summary.  MyChart is used to connect with patients for Virtual Visits (Telemedicine).  Patients are able to view lab/test results, encounter notes, upcoming appointments, etc.  Non-urgent messages can be sent to your provider as well.   To learn more about what you can do with MyChart, go to [email or web address].    Your next appointment:   12 month(s)  The format for your next appointment:   In Person  Provider:   You may see Larae Grooms, MD  or one of the following Advanced Practice Providers on your designated Care Team:    Melina Copa, PA-C  Ermalinda Barrios, PA-C    Other Instructions  You have been referred to the hypertension  clinic in our office.  Appointment is March 31,2022 at 4:00.  Please check blood pressure at home and bring readings to this appointment.  Also bring blood pressure cuff to this appointment   High-Fiber Eating Plan Fiber, also called dietary fiber, is a type of carbohydrate. It is found foods such as fruits, vegetables, whole grains, and beans. A high-fiber diet can have many health benefits. Your health care provider may recommend a high-fiber diet to help:  Prevent constipation. Fiber can make your bowel movements more regular.  Lower your cholesterol.  Relieve the following conditions: ? Inflammation of veins in the anus (hemorrhoids). ? Inflammation of specific areas of the digestive tract (uncomplicated diverticulosis). ? A problem of the large intestine, also called the colon, that sometimes causes pain and diarrhea (irritable bowel syndrome, or IBS).  Prevent overeating as part of a weight-loss plan.  Prevent heart disease, type 2 diabetes, and certain cancers. What are tips for following this plan? Reading food labels  Check the nutrition facts label on food products for the amount of dietary fiber. Choose foods that have 5 grams of fiber or more per serving.  The goals for recommended daily fiber intake include: ? Men (age 47 or younger): 34-38 g. ? Men (over age 75): 28-34 g. ? Women (age 60 or younger): 25-28 g. ? Women (over age 34): 22-25 g. Your daily fiber goal is _____________ g.   Shopping  Choose whole fruits and vegetables instead of processed forms, such as apple juice  or applesauce.  Choose a wide variety of high-fiber foods such as avocados, lentils, oats, and kidney beans.  Read the nutrition facts label of the foods you choose. Be aware of foods with added fiber. These foods often have high sugar and sodium amounts per serving. Cooking  Use whole-grain flour for baking and cooking.  Cook with brown rice instead of white rice. Meal planning  Start the  day with a breakfast that is high in fiber, such as a cereal that contains 5 g of fiber or more per serving.  Eat breads and cereals that are made with whole-grain flour instead of refined flour or white flour.  Eat brown rice, bulgur wheat, or millet instead of white rice.  Use beans in place of meat in soups, salads, and pasta dishes.  Be sure that half of the grains you eat each day are whole grains. General information  You can get the recommended daily intake of dietary fiber by: ? Eating a variety of fruits, vegetables, grains, nuts, and beans. ? Taking a fiber supplement if you are not able to take in enough fiber in your diet. It is better to get fiber through food than from a supplement.  Gradually increase how much fiber you consume. If you increase your intake of dietary fiber too quickly, you may have bloating, cramping, or gas.  Drink plenty of water to help you digest fiber.  Choose high-fiber snacks, such as berries, raw vegetables, nuts, and popcorn. What foods should I eat? Fruits Berries. Pears. Apples. Oranges. Avocado. Prunes and raisins. Dried figs. Vegetables Sweet potatoes. Spinach. Kale. Artichokes. Cabbage. Broccoli. Cauliflower. Green peas. Carrots. Squash. Grains Whole-grain breads. Multigrain cereal. Oats and oatmeal. Brown rice. Barley. Bulgur wheat. [place]. Quinoa. Bran muffins. Popcorn. Rye wafer crackers. Meats and other proteins Navy beans, kidney beans, and pinto beans. Soybeans. Split peas. Lentils. Nuts and seeds. Dairy Fiber-fortified yogurt. Beverages Fiber-fortified soy milk. Fiber-fortified orange juice. Other foods Fiber bars. The items listed above may not be a complete list of recommended foods and beverages. Contact a dietitian for more information. What foods should I avoid? Fruits Fruit juice. Cooked, strained fruit. Vegetables Fried potatoes. Canned vegetables. Well-cooked vegetables. Grains White bread. Pasta made with refined  flour. White rice. Meats and other proteins Fatty cuts of meat. Fried chicken or fried fish. Dairy Milk. Yogurt. Cream cheese. Sour cream. Fats and oils Butters. Beverages Soft drinks. Other foods Cakes and pastries. The items listed above may not be a complete list of foods and beverages to avoid. Talk with your dietitian about what choices are best for you. Summary  Fiber is a type of carbohydrate. It is found in foods such as fruits, vegetables, whole grains, and beans.  A high-fiber diet has many benefits. It can help to prevent constipation, lower blood cholesterol, aid weight loss, and reduce your risk of heart disease, diabetes, and certain cancers.  Increase your intake of fiber gradually. Increasing fiber too quickly may cause cramping, bloating, and gas. Drink plenty of water while you increase the amount of fiber you consume.  The best sources of fiber include whole fruits and vegetables, whole grains, nuts, seeds, and beans. This information is not intended to replace advice given to you by your health care provider. Make sure you discuss any questions you have with your health care provider. Document Revised: 06/24/2019 Document Reviewed: 06/24/2019 Elsevier Patient Education  2021 [employer].

## 2020-06-01 ENCOUNTER — Other Ambulatory Visit: Payer: Self-pay

## 2020-06-01 ENCOUNTER — Ambulatory Visit (INDEPENDENT_AMBULATORY_CARE_PROVIDER_SITE_OTHER): Payer: Managed Care, Other (non HMO) | Admitting: Pharmacist

## 2020-06-01 ENCOUNTER — Other Ambulatory Visit: Payer: Managed Care, Other (non HMO) | Admitting: *Deleted

## 2020-06-01 VITALS — BP 158/100 | HR 62

## 2020-06-01 DIAGNOSIS — I10 Essential (primary) hypertension: Secondary | ICD-10-CM

## 2020-06-01 DIAGNOSIS — Z8249 Family history of ischemic heart disease and other diseases of the circulatory system: Secondary | ICD-10-CM

## 2020-06-01 MED ORDER — AMLODIPINE BESYLATE 5 MG PO TABS: 5.0000 mg | ORAL_TABLET | Freq: Every day | ORAL | 1 refills | Status: DC

## 2020-06-01 MED ORDER — METOPROLOL SUCCINATE ER 25 MG PO TB24: 25.0000 mg | ORAL_TABLET | Freq: Every day | ORAL | 0 refills | Status: DC

## 2020-06-01 NOTE — Patient Instructions (Addendum)
It was nice meeting you today!  We would like your blood pressure to be less than 130/80  We will reduce your metoprolol to 25 mg once a day.  You can cut the tablets you have now in half  We will stop the lisinopril at this time and start amlodipine 5 mg once a day.    Begin to check your blood pressure at home and keep a log.  Try to take your blood pressure about 2 hours after you take your medications  Please call with any questions!  Karren Cobble, PharmD, BCACP, [place], [place] [address] [place], [place] [address] Phone: [phone]; Fax: [phone] 06/01/2020 5:05 PM

## 2020-06-01 NOTE — Progress Notes (Signed)
Patient ID: Reginald Simmons                 DOB: 17-Oct-1970                      MRN: [id number]     HPI: Reginald Simmons is a 50 y.o. male referred by Dr. Irish Lack to HTN clinic. PMH is significant for HTn, HLD and obesity. Has a strong family history of heart disease and hypertension.  PCP had trialed patient on  HTN regimens on past including metoprolol succinate 100mg  and losartan.  Was referred to cardiology in January after feelings of chest pain.  Patient presents today in good spirits. 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 Does strength exercises daily and occasionally will do cardio.    Watches his diet closely and does not add salt to his food.  Drinks a lot of water at work and home throughout the day.  Is motivated to get BP under control however is not checking his BP at home.  Brought home BP cuff which had two readings on it, both very elevated.  Patient not sure if it is accurate.  Reports he occasionally feels chest pain  Current HTN meds: lisinopril 10mg , metorpolol succinate 50mg  BP goal: <130/80  Wt Readings from Last 3 Encounters:  05/16/20 (!) 306 lb 9.6 oz (139.1 kg)  03/24/20 299 lb (135.6 kg)  02/29/20 290 lb (131.5 kg)   BP Readings from Last 3 Encounters:  05/16/20 126/76  03/24/20 132/72  03/01/20 137/88   Pulse Readings from Last 3 Encounters:  05/16/20 74  03/24/20 (!) 57  03/01/20 61    Renal function: CrCl cannot be calculated (Patient's most recent lab result is older than the maximum 21 days allowed.).  Past Medical History:  Diagnosis Date  . Allergy   . GERD (gastroesophageal reflux disease)   . Hyperlipidemia   . Onychomycosis     Current Outpatient Medications on File Prior to Visit  Medication Sig Dispense Refill  . cetirizine (ZYRTEC) 10 MG tablet Take 10 mg by mouth daily.    Marland Kitchen lisinopril (ZESTRIL) 10 MG tablet Take 1 tablet (10 mg total) by mouth daily. 30 tablet 3  . metoprolol  succinate (TOPROL-XL) 50 MG 24 hr tablet Take 1 tablet (50 mg total) by mouth daily. Take with or immediately following a meal. 30 tablet 3  . polyvinyl alcohol (LIQUIFILM TEARS) 1.4 % ophthalmic solution Place 1 drop into both eyes as needed for dry eyes.    . rosuvastatin (CRESTOR) 40 MG tablet Take 40 mg by mouth daily.     No current facility-administered medications on file prior to visit.    No Known Allergies   Assessment/Plan:  1. Hypertension - Patient BP in room 158/100 which is above goal of <130/80.  Checked with patient's home BP cuff: 157/97 so patient's cuff appears accurate.  Encouraged him to check home readings more frequently.  Discussed the role diet, exercise, stress, pain, and medications play in HTN.  Recommended patient increase his cardio exercises in the morning rather than continuously increase weight training.  Recommended continued tapering of metoprolol.  Gave patient medication options and discussed pros and cons of each such as increasing lisinopril, adding thiazide diuretic or switching to CCB. After educating patient on benefits of amlodipine in young black males, this is the option patient prefers.  Will taper down metoprolol to 25mg  and recheck in  1 month.  Reduce metoprolol to 25mg  daily Stop lisinopril 10mg  daily Start amlodipine 5 mg once daily Recheck in 4 weeks  Reginald Simmons, PharmD, BCACP, [place], [place] [address], [place], [place] [address] Phone: [phone]; Fax: [phone] 06/02/2020 5:08 PM

## 2020-06-02 LAB — BASIC METABOLIC PANEL
BUN/Creatinine Ratio: 25 — ABNORMAL HIGH (ref 9–20)
BUN: 20 mg/dL (ref 6–24)
CO2: 24 mmol/L (ref 20–29)
Calcium: 8.8 mg/dL (ref 8.7–10.2)
Chloride: 104 mmol/L (ref 96–106)
Creatinine, Ser: 0.8 mg/dL (ref 0.76–1.27)
Glucose: 93 mg/dL (ref 65–99)
Potassium: 3.6 mmol/L (ref 3.5–5.2)
Sodium: 141 mmol/L (ref 134–144)
eGFR: 109 mL/min/{1.73_m2} (ref >59)

## 2020-06-29 ENCOUNTER — Ambulatory Visit (INDEPENDENT_AMBULATORY_CARE_PROVIDER_SITE_OTHER): Payer: Managed Care, Other (non HMO) | Admitting: Pharmacist

## 2020-06-29 ENCOUNTER — Other Ambulatory Visit: Payer: Self-pay

## 2020-06-29 DIAGNOSIS — I10 Essential (primary) hypertension: Secondary | ICD-10-CM

## 2020-06-29 MED ORDER — CHLORTHALIDONE 25 MG PO TABS: 25.0000 mg | ORAL_TABLET | Freq: Every day | ORAL | 3 refills | Status: DC

## 2020-06-29 MED ORDER — AMLODIPINE BESYLATE 5 MG PO TABS: 5.0000 mg | ORAL_TABLET | Freq: Every day | ORAL | 3 refills | Status: DC

## 2020-06-29 NOTE — Patient Instructions (Addendum)
STOP taking metoprolol  START taking chlorthalidone 12.5mg  daily (1/2 tablet)  CONTINUE amlodipine 5mg  daily  Call me at [phone] with any questions  Hypertension "High blood pressure"  Hypertension is often called "The Silent Killer." It rarely causes symptoms until it is extremely  high or has done damage to other organs in the body. For this reason, you should have your  blood pressure checked regularly by your physician. We will check your blood pressure  every time you see a provider at one of our offices.   Your blood pressure reading consists of two numbers. Ideally, blood pressure should be  below 120/80. The first ("top") number is called the systolic pressure. It measures the  pressure in your arteries as your heart beats. The second ("bottom") number is called the diastolic pressure. It measures the pressure in your arteries as the heart relaxes between beats.  The benefits of getting your blood pressure under control are enormous. A 10-point  reduction in systolic blood pressure can reduce your risk of stroke by 27% and heart failure by 28%  Your blood pressure goal is <130/80  To check your pressure at home you will need to:  1. Sit up in a chair, with feet flat on the floor and back supported. Do not cross your ankles or legs. 2. Rest your left arm so that the cuff is about heart level. If the cuff goes on your upper arm,  then just relax the arm on the table, arm of the chair or your lap. If you have a wrist cuff, we  suggest relaxing your wrist against your chest (think of it as Pledging the Flag with the  wrong arm).  3. Place the cuff snugly around your arm, about 1 inch above the crook of your elbow. The  cords should be inside the groove of your elbow.  4. Sit quietly, with the cuff in place, for about 5 minutes. After that 5 minutes press the power  button to start a reading. 5. Do not talk or move while the reading is taking place.  6. Record your  readings on a sheet of paper. Although most cuffs have a memory, it is often  easier to see a pattern developing when the numbers are all in front of you.  7. You can repeat the reading after 1-3 minutes if it is recommended  Make sure your bladder is empty and you have not had caffeine or tobacco within the last 30 min  Always bring your blood pressure log with you to your appointments. If you have not brought your monitor in to be double checked for accuracy, please bring it to your next appointment.  You can find a list of validated (accurate) blood pressure cuffs at PopPath.it  Healthy Diet  SALT  What is the big deal with sodium? Why the need to limit our intake? What is the connection to  blood pressure? And what is the difference between salt and sodium? Sodium attracts water. Think about it. When you eat an overly salty snack, you tend to become  thirsty and need more water. If you have too much sodium in your bloodstream, your body will  then pull water into the bloodstream as well, trying to correct the imbalance. When you have  more volume in the bloodstream, your blood pressure goes up. Your heart must work harder to  pump the extra volume, and the increase in pressure can wear out blood vessels faster. You may  also notice  bloating and weight gain. Hypertension is one of the leading risk factors for heart  disease. By limiting sodium intake throughout life you are helping decrease your risk of heart  disease later on.   Salt is made up of two minerals. Sodium and chloride. A teaspoon of salt contains about 40%  sodium and 60% chloride. One teaspoon of salt has 2,300 mg of sodium. While the current  USDA guideline states you should consume no more than 2,300 mg per day, both they and the  American Heart Association recommend that you limit this to 1,500 mg to stay healthy. Sea salt  or [place] pink salt may have a slightly different taste, but they still have almost the  same  percent of sodium per teaspoon. So feel free to use them instead of table salt, but don't use  more.  A common myth is that if you don't add salt to your food, you are following a low sodium diet.  However, 75% of the sodium consumed in the American diet is from processed foods, NOT the  salt shaker. We all know that chips and crackers are high in sodium, but there are many other  foods that we may not think of when limiting our sodium. Below are the "salty six" foods that  the American Heart Association wants you to be aware of. 1. Cold cuts - even the healthy sliced [place] can have over 1,000 mg of sodium per slice.   Compare different brands to see which has less sodium if you eat these regularly 2. Pizza - depending on your toppings, a slice of pizza can have up to 760 mg of   sodium. Put more veggies on it or just have a slice with a side salad and still enjoy. 3. Soup - yes even that old home remedy of chicken soup is loaded with sodium. Look   for low sodium versions. Or add a bunch of frozen veggies when heating it, this will   give you less sodium per serving 4. Breads - they may not taste salty, but a single slice of bread can have up to 230 mg of   sodium. Toast for breakfast, a sandwich at lunch and a dinner roll can quickly add up   to over 900 mg in just one day.  5. Chicken - some fresh or frozen chicken is injected with a sodium solution before it   reaches the store. A 4 oz serving should have no more than 100 mg sodium. And   watch for breaded frozen chicken nuggets, strips and tenders. They may seem like a   quick and easy "healthy" meal, but they have high amounts of sodium as well. 6. Burritos/tacos - just 2 teaspoons of taco seasoning can have over 400 mg sodium.   Try making your own with equal parts of cumin, oregano, chili powder and garlic powder.   SUGAR  Sugar is a huge problem in the modern day diet. Sugar is a HUGE contributor to heart disease,  diabetes, high triglyceride levels, fatty liver diease and obesity. Sugar is hidden in almost all packaged foods/beverages. It adds no nutritional benefit to your body and can cause major harm. Added sugar is extra sugar that is added beyond what is naturally found. The American Heart Association recommends limiting added sugars to no more than 25g for women and 36 grams for men per day.  There are many names for sugar maltose, sucrose (names ending in "ose"), high fructose corn syrup,  molasses, cane sugar, corn sweetener, raw sugar, syrup, honey or fruit juice concentrate.   One of the best ways to limit your added sugars is to stop drinking sweetened beverages such as soda, sweet tea, fruit juice or fancy coffee's. There is 65g of added sugars in one 20oz bottle of Coke!! That is equal to 6 donuts.   Pay attention and read all nutrition facts labels. Below is an examples of a nutrition facts label. The #1 is showing you the total sugars where the # 2 is showing you the added sugars. This one severing has almost the max amount of added sugars per day!  Watch out for items that say "low fat" or "no added sugar" as these products are typically very high in sugar. The food industry uses these terms to fool you into thinking they are healthy.  For more information on the dangers of sugar watch WHY Sugar is as Bad as Alcohol (Fructose, The Liver Toxin) on YouTube.    EXERCISE  Exercise can help lower your blood pressure ~5 points systolic (top #) and 8 points diastolic (bottom #)  Exercise is good. We've all heard that. In an ideal world, we would all have time and resources to  get plenty of it. When you are active your heart pumps more efficiently and you will feel better.  Multiple studies show that even walking regularly has benefits that include living a longer life.  The American Heart Association recommends 90-150 minutes per week of exercise (30 minutes  per day most days of the week). You  can do this in any increment you wish. Nine or more  10-minute walks count. So does an hour-long exercise class. Break the time apart into what will  work in your life. Some of the best things you can do include walking briskly, jogging, cycling or  swimming laps. Not everyone is ready to "exercise." Sometimes we need to start with just getting active. Here  are some easy ways to be more active throughout the day: Marland Kitchen Take the stairs instead of the elevator . Go for a 10-15 minute walk during your lunch break (find a friend to make it more enjoyable) . When shopping, park at the back of the parking lot . If you take public transportation, get off one stop early and walk the extra distance . Pace around while making phone calls (most of [place] are not attached to phone cords any longer!) Check with your doctor if you aren't sure what your limitations may be. Always remember to drink plenty of water when doing any type of exercise. Don't feel like a failure if you're not getting the 90-150 minutes per week. If you started by being  a couch potato, then just a 10-minute walk each day is a huge improvement. Start with little  victories and work your way up.   Healthy Eating Tips  When looking to improve your eating habits, whether to lose weight, lower blood pressure or just be healthier, it helps to know what a serving size is.   Grains 1 slice of bread,  bagel,  cup pasta or rice  Vegetables 1 cup fresh or raw vegetables,  cup cooked or canned Fruits 1 piece of medium sized fruit,  cup canned,   Meats/Proteins  cup dried       1 oz meat, 1 egg,  cup cooked beans, nuts or seeds  Dairy        Fats Individual yogurt container, 1 cup (8oz)  1 teaspoon margarine/butter or vegetable  milk or milk alternative, 1 slice of cheese          oil; 1 tablespoon mayonnaise or salad dressing                  Plan ahead: make a menu of the meals for a week then create a grocery list to go with   that menu. Consider meals that easily stretch into a night of leftovers, such as stews or  casseroles. Or consider making two of your favorite meal and put one in the freezer or fridge for  another night.  When you get home from the grocery store wash and prepare your vegetables and fruits.  Then when you need them they are ready to go.  Tips for going to the grocery store: . Buy store or generic brands . Check the weekly ad from your store on-line or in their in-store flyer . Look at the unit price on the shelf tag to compare/contrast the costs of different items . Buy fruits/vegetables in season . Carrots, bananas and apples are low-cost, naturally healthy items . If meats or frozen vegetables are on sale, buy some extras and put in your freezer . Limit buying prepared or "ready to eat" items, even if they are pre-made salads or fruit snacks . Do not shop when you're hungry . Foods at eye level tend to be more expensive. Look on the high and low shelves for deals. . Consider shopping at the farmer's market for fresh foods in season. . Choose canned tuna or salmon instead of fresh . Avoid the cookie and chip aisles (these are expensive, high in calories and low in  nutritional value) Healthy food preparations: . If you can't get lean hamburger, be sure to drain the fat when cooking . Steam, saut (in olive oil), grill or bake foods . Experiment with different seasonings to avoid adding salt to your foods. Kosher salt, sea salt and [place] salt are all still salt and should be avoided        Aim to have one 12 hour fast each day. This means no eating after dinner until breakfast. For example, if you eat dinner around 6 PM then you would not eat anything until 6 AM the next day. This is a great way to help lower your insulin levels, lose weight and reduce your blood pressure.  Resources: American Heart Association - InstantFinish.fi Go to the Healthy Living tab to get more  information American Diabetes Association - www.diabetes.org You don't have to be diabetic - check out the Food and Fitness tab  DASH diet - https://wilson-eaton.com/ Health topics - or just search on their home page for DASH Quit for Life - www.cancer.org Follow the Stay Healthy tab to learn more about smoking cessatio  Steel cut oats with fruit and nuts Natural PB Snack on nuts, fruit, vegetables Side dishes- brown rice, lentils, barley Watch the added sugars! Avoid processed foods and eating out

## 2020-06-29 NOTE — Progress Notes (Signed)
Patient ID: Reginald Simmons                 DOB: December 20, 1970                      MRN: [id number]     HPI: Reginald Simmons is a 50 y.o. male referred by Dr. Irish Lack to HTN clinic. PMH is significant for HTN, HLD and obesity. Has a strong family history of heart disease and hypertension.  PCP had trialed patient on  HTN regimens on past including metoprolol succinate 100mg  and losartan.  Was referred to cardiology in January after feelings of chest pain.  Patient presents today in good spirits. 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 Does strength exercises daily and occasionally will do cardio.    Watches his diet closely and does not add salt to his food.  Drinks a lot of water at work and home throughout the day. Home cuff was found to be accurate at previous visit.  At last visit, lisinopril was stopped and amlodipine 5mg  daily was started. His metoprolol was also decreased to 25mg  daily.  Patient presents today for follow up. He is very motivated to lose weight, lower his blood pressure and live healthier. Has checked his blood pressure a few times.It was 140's a few days ago. Today was 732 systolic at home. 148/90 in clinic today.  Current HTN meds: metorpolol succinate 25mg , amlodipine 5mg   BP goal: <130/80  Diet: Breakfast: oatmeal or impossible sandwich Lunch: salad fro zaxyby, chicken breast with mixed vegetables or kale salad Snacks: oreos, white chedder pop-corn, dorittios Dinner: tuna fish with crackers Drinks: water  Wt Readings from Last 3 Encounters:  05/16/20 (!) 306 lb 9.6 oz (139.1 kg)  03/24/20 299 lb (135.6 kg)  02/29/20 290 lb (131.5 kg)   BP Readings from Last 3 Encounters:  06/01/20 (!) 158/100  05/16/20 126/76  03/24/20 132/72   Pulse Readings from Last 3 Encounters:  06/01/20 62  05/16/20 74  03/24/20 (!) 57    Renal function: CrCl cannot be calculated (Patient's most recent lab result is older than the  maximum 21 days allowed.).  Past Medical History:  Diagnosis Date  . Allergy   . GERD (gastroesophageal reflux disease)   . Hyperlipidemia   . Onychomycosis     Current Outpatient Medications on File Prior to Visit  Medication Sig Dispense Refill  . amLODipine (NORVASC) 5 MG tablet Take 1 tablet (5 mg total) by mouth daily. 90 tablet 1  . cetirizine (ZYRTEC) 10 MG tablet Take 10 mg by mouth daily.    . metoprolol succinate (TOPROL-XL) 25 MG 24 hr tablet Take 1 tablet (25 mg total) by mouth daily. 30 tablet 0  . polyvinyl alcohol (LIQUIFILM TEARS) 1.4 % ophthalmic solution Place 1 drop into both eyes as needed for dry eyes.    . rosuvastatin (CRESTOR) 40 MG tablet Take 40 mg by mouth daily.     No current facility-administered medications on file prior to visit.    No Known Allergies   Assessment/Plan:  1. Hypertension - Patient BP in room is above goal of <130/80. We discussed the lack of benefit of metoprolol on his blood pressure and lack of compelling indication for a beta blocker. Talks about the benefits of 2 BP medications vs increasing the dose of just one. We also discussed the benefits of thiazides in blacks. Will STOP metoprolol, START chlorthalidone 12.5mg   daily and continue amlodipine 5mg  daily. We discussed diet in detail. Warned against sugars. Recommended against the "impossible" foods as a lot of them are ultra processed.  Focus on a diet with whole grains, fresh foods- fruits, vegetables, nuts. Detailed handout provided. Follow up in clinic in 3 weeks.   Thank you,  Ramond Dial, Pharm.D, BCPS, CPP [facility]  [address], [place], [place] [address]  Phone: [phone]; Fax: [phone]  06/29/2020 8:07 AM

## 2020-07-20 ENCOUNTER — Ambulatory Visit (INDEPENDENT_AMBULATORY_CARE_PROVIDER_SITE_OTHER): Payer: Managed Care, Other (non HMO) | Admitting: Pharmacist

## 2020-07-20 ENCOUNTER — Encounter: Payer: Self-pay | Admitting: Pharmacist

## 2020-07-20 ENCOUNTER — Other Ambulatory Visit: Payer: Self-pay

## 2020-07-20 VITALS — BP 138/84 | HR 74 | Wt 301.2 lb

## 2020-07-20 DIAGNOSIS — I10 Essential (primary) hypertension: Secondary | ICD-10-CM | POA: Diagnosis not present

## 2020-07-20 NOTE — Patient Instructions (Addendum)
It was good seeing you again  Your blood pressure is improving but still a little higher than your goal of <130/80  Continue your diet changes and exercise routine  Continue your amlodipine 5 mg once daily Continue your chlorthalidone 12.5 mg once daily  We will check your lab work today and I will call you tomorrow with our next steps  Karren Cobble, PharmD, BCACP, [place], [place] [address], [place], [place] [address] Phone: [phone]; Fax: [phone] 07/20/2020 3:55 PM

## 2020-07-20 NOTE — Progress Notes (Signed)
Patient ID: OAKLYN MANS                 DOB: [birth year]                      MRN: [id number]     HPI:  Reginald Simmons is a 50 y.o. male referred by Dr. Irish Lack to HTN clinic. PMH is significant for HTN, HLD and obesity. Has a strong family history of heart disease and hypertension.  PCP had trialed patient on  HTN regimens on past including metoprolol succinate 100mg  and losartan.  Was referred to cardiology in January after feelings of chest pain.  Patient presents today in good spirits. 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 Does strength exercises daily and occasionally will do cardio.    Watches his diet closely and does not add salt to his food.  Drinks a lot of water at work and home throughout the day. Home cuff was found to be accurate at previous visit.  At last visit metoprolol was discontinued and patient was started on chlorthalidone in addition to amlodipine.  Patient was also given diet education and has been watching how much sugar and carbohydrates he has been eating.  Has increased his fruit intake and is doing more cardio exercises than strength training now.  Reports he is feeling much better and has more energy.  Has not been checking BP at home/.   Current HTN meds: amlodipine 5 mg, chlorthalidone 12.5mg  daily Previously tried: Toprol XL BP goal: <130/80  Wt Readings from Last 3 Encounters:  05/16/20 (!) 306 lb 9.6 oz (139.1 kg)  03/24/20 299 lb (135.6 kg)  02/29/20 290 lb (131.5 kg)   BP Readings from Last 3 Encounters:  06/29/20 (!) 148/90  06/01/20 (!) 158/100  05/16/20 126/76   Pulse Readings from Last 3 Encounters:  06/29/20 67  06/01/20 62  05/16/20 74    Renal function: CrCl cannot be calculated (Patient's most recent lab result is older than the maximum 21 days allowed.).  Past Medical History:  Diagnosis Date  . Allergy   . GERD (gastroesophageal reflux disease)   . Hyperlipidemia   .  Onychomycosis     Current Outpatient Medications on File Prior to Visit  Medication Sig Dispense Refill  . amLODipine (NORVASC) 5 MG tablet Take 1 tablet (5 mg total) by mouth daily. 90 tablet 3  . cetirizine (ZYRTEC) 10 MG tablet Take 10 mg by mouth daily.    . chlorthalidone (HYGROTON) 25 MG tablet Take 1 tablet (25 mg total) by mouth daily. 15 tablet 3  . polyvinyl alcohol (LIQUIFILM TEARS) 1.4 % ophthalmic solution Place 1 drop into both eyes as needed for dry eyes.    . rosuvastatin (CRESTOR) 40 MG tablet Take 40 mg by mouth daily.     No current facility-administered medications on file prior to visit.    No Known Allergies   Assessment/Plan:  1. Hypertension - Patient BP in room today 138/84 which is above goal of <130/80 but is improved.  This is patient's third visit and systolic BP has improved from 158 to 148 to 138 now.  Discussed next steps with patient such as increasing chlorthalidone or amlodipine.  Will get BMP today to check renal function and electrolytes and then make decision.  Patient voiced understanding.  Continue chlorthalidone 12.5mg  Continue amlodipine 5mg  daily Check BMP Plan on increasing amlodipine or chlorthalidone tomorrow depending on  lab results Recheck in 4 weeks  Karren Cobble, PharmD, BCACP, [place], [place] [address], [place], [place] [address] Phone: [phone]; Fax: [phone] 07/20/2020 4:52 PM

## 2020-07-21 ENCOUNTER — Telehealth: Payer: Self-pay | Admitting: Pharmacist

## 2020-07-21 DIAGNOSIS — I10 Essential (primary) hypertension: Secondary | ICD-10-CM

## 2020-07-21 LAB — BASIC METABOLIC PANEL
BUN/Creatinine Ratio: 26 — ABNORMAL HIGH (ref 9–20)
BUN: 29 mg/dL — ABNORMAL HIGH (ref 6–24)
CO2: 25 mmol/L (ref 20–29)
Calcium: 8.9 mg/dL (ref 8.7–10.2)
Chloride: 96 mmol/L (ref 96–106)
Creatinine, Ser: 1.1 mg/dL (ref 0.76–1.27)
Glucose: 95 mg/dL (ref 65–99)
Potassium: 4 mmol/L (ref 3.5–5.2)
Sodium: 135 mmol/L (ref 134–144)
eGFR: 83 mL/min/{1.73_m2} (ref >59)

## 2020-07-21 MED ORDER — AMLODIPINE BESYLATE 10 MG PO TABS: 10.0000 mg | ORAL_TABLET | Freq: Every day | ORAL | 3 refills | Status: DC

## 2020-07-21 NOTE — Telephone Encounter (Signed)
Spoke with patient regarding lab results.  Will increase amlodipine to 10mg  and patient will start checking his BP at home more regularly.

## 2020-08-24 ENCOUNTER — Other Ambulatory Visit: Payer: Self-pay

## 2020-08-24 ENCOUNTER — Ambulatory Visit (INDEPENDENT_AMBULATORY_CARE_PROVIDER_SITE_OTHER): Payer: Managed Care, Other (non HMO) | Admitting: Pharmacist

## 2020-08-24 DIAGNOSIS — I10 Essential (primary) hypertension: Secondary | ICD-10-CM

## 2020-08-24 MED ORDER — AMLODIPINE BESYLATE 10 MG PO TABS: 10.0000 mg | ORAL_TABLET | Freq: Every day | ORAL | 3 refills | Status: DC

## 2020-08-24 MED ORDER — CHLORTHALIDONE 25 MG PO TABS: 25.0000 mg | ORAL_TABLET | Freq: Every day | ORAL | 3 refills | Status: DC

## 2020-08-24 MED ORDER — ROSUVASTATIN CALCIUM 40 MG PO TABS: 40.0000 mg | ORAL_TABLET | Freq: Every day | ORAL | 3 refills | Status: DC

## 2020-08-24 NOTE — Progress Notes (Signed)
Patient ID: Reginald Simmons                 DOB: March 10, 1970                      MRN: [id number]     HPI:  Reginald Simmons is a 50 y.o. male referred by Dr. Irish Lack to HTN clinic. PMH is significant for HTN, HLD, obesity, and COVID infection 09/2018. Has a strong family history of heart disease and hypertension.  PCP had trialed patient on  HTN regimens on past including metoprolol succinate 100mg  and losartan. Presented to ED 02/2020 with elevated BP and chest discomfort, BP was 166/93, no medication changes were made. He has been seen in HTN clinic a few times this year. Lisinopril was changed to amlodipine CCB and thiazides are preferred first line therapy for HTN in African Americans due to better efficacy. Most recently, amlodipine was increased to 10mg  daily.   Patient presents today in good spirits. He is very motivated to lose weight, lower his blood pressure and live healthier. 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 Does strength exercises and cardio regularly. Watches his diet closely and does not add salt to his food, tries to aim for < 1,500mg  or even < 1,000mg  sodium each day. Uses Mrs Deliah Boston salt substitute. Cut back on sugary snacks as well and now snacks on fruit. Drinks a lot of water at work and home throughout the day. Has been trying to lose weight through lifestyle improvements. Not interested in starting medication for weight loss.  Home cuff was found to be accurate at PCP office. Recalls SBP readings of 125, 878, and 676, systolic in the 72C over the past few weeks. Denies headaches, blurred vision, LE edema. Occasionally will notice slight dizziness but this is not new since starting BP meds, frequently works in hot areas without [facility] which may be contributing.  Of note, pt has been taking chlorthalidone 25mg  rather than 12.5mg  daily since it's been prescribed. AVS from 4/28 visit stated to start chlorthalidone 12.5mg  daily, however rx  was sent in with instructions to take 25mg  daily. This was not noted at last visit; BMET from 5/19 reflects pt taking chlorthalidone 25mg  daily.  He also takes both BP medications in the evening and wakes up around 1-2am to use the restroom which is new since starting BP meds.  Current HTN meds: amlodipine 10mg  (PM), chlorthalidone 25mg  daily (PM)  Previously tried: Toprol XL, losartan, lisinopril - no side effects with any  BP goal: <130/39mmHg  Diet: Breakfast: steel cut oatmeal Lunch: salad from [employer], chicken breast with mixed vegetables or kale salad Snacks: has cut back on sugar, now snacking on fruit more  Dinner: tuna fish with crackers, chicken kebabs with vegetables, cooking with olive oil Drinks: water  Exercise: works out in [employer] every morning. Does strength exercises daily and cardio, more calisthenics now too  Social History: Former tobacco abuse 1/4 PPD, quit in 1996, denies alcohol and drug use.   Family History: Mother had coronary disease in her 76s.  Father had a stroke around age 65.  He has 4 siblings and none of them have heart disease.  Wt Readings from Last 3 Encounters:  07/20/20 (!) 301 lb 3.2 oz (136.6 kg)  05/16/20 (!) 306 lb 9.6 oz (139.1 kg)  03/24/20 299 lb (135.6 kg)   BP Readings from Last 3 Encounters:  07/20/20 138/84  06/29/20 (!) 148/90  06/01/20 (!) 158/100   Pulse Readings from Last 3 Encounters:  07/20/20 74  06/29/20 67  06/01/20 62    Renal function: CrCl cannot be calculated (Patient's most recent lab result is older than the maximum 21 days allowed.).  Past Medical History:  Diagnosis Date   Allergy    GERD (gastroesophageal reflux disease)    Hyperlipidemia    Onychomycosis     Current Outpatient Medications on File Prior to Visit  Medication Sig Dispense Refill   amLODipine (NORVASC) 10 MG tablet Take 1 tablet (10 mg total) by mouth daily. 180 tablet 3   cetirizine (ZYRTEC) 10 MG tablet Take 10 mg by mouth daily.      chlorthalidone (HYGROTON) 25 MG tablet Take 1 tablet (25 mg total) by mouth daily. 15 tablet 3   polyvinyl alcohol (LIQUIFILM TEARS) 1.4 % ophthalmic solution Place 1 drop into both eyes as needed for dry eyes.     rosuvastatin (CRESTOR) 40 MG tablet Take 40 mg by mouth daily.     No current facility-administered medications on file prior to visit.    No Known Allergies   Assessment/Plan:  1. Hypertension - BP close to goal <130/45mmHg in clinic, some home readings are at goal. Pt prefers to continue on amlodipine 10mg  daily and chlorthalidone 25mg  daily and focus efforts on continued lifestyle improvements. Did advise pt to move chlorthalidone to AM dosing to resolve nighttime urination. Congratulated pt on efforts including limiting daily sodium intake to < 1000-1500mg , using Mrs Deliah Boston salt substitute, avoiding caffeine, and decreasing sweets. He'll continue with daily strength and cardio workouts. Follow up in clinic in 2 months for BP check. Can consider adding ACEi/ARB if needed.  [name], PharmD, BCACP, [place] [address] [place], [place] [address] Phone: [phone]; Fax: [phone] 08/24/2020 4:14 PM

## 2020-08-24 NOTE — Patient Instructions (Addendum)
It was nice to see you today!  Your blood pressure goal is < 130/89mmHg  Move your chlorthalidone to the morning. Continue taking your amlodipine in the evening  Continue with your exercise and heart healthy diet.   Continue to monitor your blood pressure at home  Follow up in clinic in 2 months for blood pressure check

## 2020-10-12 ENCOUNTER — Other Ambulatory Visit: Payer: Self-pay

## 2020-10-13 ENCOUNTER — Ambulatory Visit: Payer: Managed Care, Other (non HMO) | Admitting: Family Medicine

## 2020-10-13 ENCOUNTER — Other Ambulatory Visit: Payer: Self-pay | Admitting: Family Medicine

## 2020-10-13 ENCOUNTER — Encounter: Payer: Self-pay | Admitting: Family Medicine

## 2020-10-13 VITALS — BP 120/76 | HR 55 | Temp 98.6°F | Ht 73.0 in | Wt 289.2 lb

## 2020-10-13 DIAGNOSIS — I10 Essential (primary) hypertension: Secondary | ICD-10-CM | POA: Diagnosis not present

## 2020-10-13 NOTE — Progress Notes (Signed)
   Subjective:    Patient ID: LESHAWN KOLESNIK, male    DOB: Jan 27, 1971, 50 y.o.   MRN: [id number]  HPI Here for help with an insurance form. This needs to be filled in for his wife's insurance company. He feels great, and his BP has been well controlled.    Review of Systems  Constitutional: Negative.   Respiratory: Negative.    Cardiovascular: Negative.       Objective:   Physical Exam Constitutional:      Appearance: Normal appearance.  Cardiovascular:     Rate and Rhythm: Normal rate and regular rhythm.     Pulses: Normal pulses.     Heart sounds: Normal heart sounds.  Pulmonary:     Effort: Pulmonary effort is normal.     Breath sounds: Normal breath sounds.  Neurological:     Mental Status: He is alert.          Assessment & Plan:  HTN is stable. We will get labs to fill out the form.  Alysia Penna, MD

## 2020-10-16 LAB — SAR COV2 SEROLOGY (COVID19)AB(IGG),IA
SARS-CoV-2 Semi-Quant IgG Ab: >800 AU/mL (ref <13.0)
SARS-CoV-2 Spike Ab Interp: POSITIVE

## 2020-10-16 LAB — BASIC METABOLIC PANEL
BUN/Creatinine Ratio: 16 (ref 9–20)
BUN: 14 mg/dL (ref 6–24)
CO2: 26 mmol/L (ref 20–29)
Calcium: 8.7 mg/dL (ref 8.7–10.2)
Chloride: 100 mmol/L (ref 96–106)
Creatinine, Ser: 0.87 mg/dL (ref 0.76–1.27)
Glucose: 88 mg/dL (ref 65–99)
Potassium: 3.5 mmol/L (ref 3.5–5.2)
Sodium: 139 mmol/L (ref 134–144)
eGFR: 106 mL/min/{1.73_m2} (ref >59)

## 2020-10-16 LAB — HGB A1C W/O EAG: Hgb A1c MFr Bld: 6.2 % — ABNORMAL HIGH (ref 4.8–5.6)

## 2020-10-16 LAB — LIPID PANEL W/O CHOL/HDL RATIO
Cholesterol, Total: 160 mg/dL (ref 100–199)
HDL: 58 mg/dL (ref >39)
LDL Chol Calc (NIH): 85 mg/dL (ref 0–99)
Triglycerides: 89 mg/dL (ref 0–149)
VLDL Cholesterol Cal: 17 mg/dL (ref 5–40)

## 2020-10-18 ENCOUNTER — Telehealth: Payer: Self-pay

## 2020-10-18 NOTE — Telephone Encounter (Signed)
Pt Health Assessment Form from [facility] has been completed and faxed to [place]. Pt notified and copy sent to scanning

## 2020-10-26 ENCOUNTER — Ambulatory Visit (INDEPENDENT_AMBULATORY_CARE_PROVIDER_SITE_OTHER): Payer: Managed Care, Other (non HMO) | Admitting: Pharmacist

## 2020-10-26 ENCOUNTER — Other Ambulatory Visit: Payer: Self-pay

## 2020-10-26 VITALS — BP 136/88 | HR 64 | Wt 293.0 lb

## 2020-10-26 DIAGNOSIS — I10 Essential (primary) hypertension: Secondary | ICD-10-CM | POA: Diagnosis not present

## 2020-10-26 NOTE — Progress Notes (Signed)
Patient ID: WALFRED FARSON                 DOB: February 20, 1971                      MRN: [id number]     HPI:  Reginald Simmons is a 50 y.o. male referred by Dr. Irish Lack to HTN clinic. PMH is significant for HTN, HLD, obesity, and COVID infection 09/2018. Has a strong family history of heart disease and hypertension.  PCP had trialed patient on  HTN regimens on past including metoprolol succinate '100mg'$  and losartan. Presented to ED 02/2020 with elevated BP and chest discomfort, BP was 166/93, no medication changes were made. He has been seen in HTN clinic a few times this year. Lisinopril was changed to amlodipine as CCB and thiazides are preferred first line therapy for HTN in African Americans due to better efficacy. Most recently, amlodipine was increased to '10mg'$  daily and chlorthalidone dosing was moved to the AM. Pt focused on improving diet/exercise to help control HTN.   Patient presents today in good spirits. He is very motivated to lose weight, lower his blood pressure and live healthier. 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 Does strength exercises and cardio regularly. Watches his diet closely and does not add salt to his food, tries to aim for < 1,'500mg'$  or even < 1,'000mg'$  sodium each day. Uses Mrs Deliah Boston salt substitute. Cut back on sugary snacks as well and now snacks on fruit. Drinks a lot of water at work and home throughout the day. Has been trying to lose weight through lifestyle improvements. Not interested in starting medication for weight loss.  Home cuff was found to be accurate at PCP office. Recalls SBP ranging 128-132 over the past few weeks but hasn't been checking readings very consistently. Denies headaches, blurred vision, LE edema. Occasionally will notice slight dizziness but this is not new since starting BP meds. Has noticed a decrease in nighttime urination since moving chlorthalidone to AM dosing. BP well controlled at PCP visit a  few weeks ago. Bought a pill organizer to help with his meds.  Current HTN meds: amlodipine '10mg'$  (PM), chlorthalidone '25mg'$  daily (AM)  Previously tried: Toprol XL, losartan, lisinopril - no side effects with any  BP goal: <130/41mHg  Diet: Breakfast: steel cut oatmeal Lunch: salad from [employer], chicken breast with mixed vegetables or kale salad Snacks: has cut back on sugar, now snacking on fruit more  Dinner: tuna fish with crackers, chicken kebabs with vegetables, cooking with olive oil Drinks: water  Ex[place]. Does strength exercises daily and cardio, more calisthenics now too  Social History: Former tobacco abuse 1/4 PPD, quit in 1996, denies alcohol and drug use.   Family History: Mother had coronary disease in her 570s  Father had a stroke around age 48  He has 4 siblings and none of them have heart disease.  Wt Readings from Last 3 Encounters:  10/13/20 289 lb 3.2 oz (131.2 kg)  07/20/20 (!) 301 lb 3.2 oz (136.6 kg)  05/16/20 (!) 306 lb 9.6 oz (139.1 kg)   BP Readings from Last 3 Encounters:  10/13/20 120/76  08/24/20 136/86  07/20/20 138/84   Pulse Readings from Last 3 Encounters:  10/13/20 (!) 55  08/24/20 71  07/20/20 74    Renal function: Estimated Creatinine Clearance: 147.5 mL/min (by C-G formula based on SCr  of 0.87 mg/dL).  Past Medical History:  Diagnosis Date   Allergy    GERD (gastroesophageal reflux disease)    Hyperlipidemia    Onychomycosis     Current Outpatient Medications on File Prior to Visit  Medication Sig Dispense Refill   amLODipine (NORVASC) 10 MG tablet Take 1 tablet (10 mg total) by mouth daily. 90 tablet 3   cetirizine (ZYRTEC) 10 MG tablet Take 10 mg by mouth daily.     chlorthalidone (HYGROTON) 25 MG tablet Take 1 tablet (25 mg total) by mouth daily. 90 tablet 3   polyvinyl alcohol (LIQUIFILM TEARS) 1.4 % ophthalmic solution Place 1 drop into both eyes as needed for dry eyes.      rosuvastatin (CRESTOR) 40 MG tablet Take 1 tablet (40 mg total) by mouth daily. 90 tablet 3   No current facility-administered medications on file prior to visit.    No Known Allergies   Assessment/Plan:  1. Hypertension - BP close to goal <130/76mHg in clinic, most home readings are at goal. Pt prefers to continue on amlodipine '10mg'$  daily and chlorthalidone '25mg'$  daily and focus efforts on continued lifestyle improvements. Encouraged pt to continue with lifestyle efforts including limiting daily sodium intake to < 1000-'1500mg'$ , using Mrs DDeliah Bostonsalt substitute, avoiding caffeine, and decreasing sweets. He'll continue with daily strength and cardio workouts. Advised pt to start checking BP a few times a week. I will call pt to follow up with home readings in ~3 weeks.  [place] Silveria E. Davon Folta, PharmD, BCACP, [id number]. [address] [place] [address]Phone: [phone] Fax: [phone]/25/2022 11:12 AM

## 2020-10-26 NOTE — Patient Instructions (Addendum)
It was nice to see you today!  Your blood pressure goal is < 130/5mHg  Continue taking your current blood pressure medications: -Chlorthalidone '25mg'$  - 1 tablet in the morning -Amlodipine '10mg'$  - 1 tablet in the evening  Check your blood pressure a few days a week and record your readings  I'll give you a call in a few weeks to see how they're looking

## 2020-10-30 ENCOUNTER — Telehealth: Payer: Self-pay

## 2020-10-30 DIAGNOSIS — R7303 Prediabetes: Secondary | ICD-10-CM

## 2020-10-30 NOTE — Telephone Encounter (Signed)
The referral was done  

## 2020-10-30 NOTE — Telephone Encounter (Signed)
Pt has been notified through [employer]

## 2020-11-16 ENCOUNTER — Telehealth: Payer: Self-pay | Admitting: Pharmacist

## 2020-11-16 NOTE — Telephone Encounter (Signed)
Called pt to follow up with home BP readings. Systolic has ranged [id number], readings typically right around 130/80. Tolerating his meds well and has continued with heart healthy diet and frequent exercise. Will continue amlodipine and chlorthalidone and advised pt to call clinic if his systolic BP becomes consistently elevated > 140. He was appreciative for the call.

## 2020-12-08 ENCOUNTER — Encounter: Payer: Self-pay | Admitting: Family Medicine

## 2020-12-08 ENCOUNTER — Encounter: Payer: 59 | Admitting: Family Medicine

## 2020-12-08 ENCOUNTER — Other Ambulatory Visit: Payer: Self-pay

## 2020-12-08 ENCOUNTER — Ambulatory Visit (INDEPENDENT_AMBULATORY_CARE_PROVIDER_SITE_OTHER): Payer: Managed Care, Other (non HMO) | Admitting: Family Medicine

## 2020-12-08 VITALS — BP 120/80 | HR 63 | Temp 99.2°F | Ht 73.0 in | Wt 282.0 lb

## 2020-12-08 DIAGNOSIS — Z0000 Encounter for general adult medical examination without abnormal findings: Secondary | ICD-10-CM

## 2020-12-08 LAB — CBC WITH DIFFERENTIAL/PLATELET
Basophils Absolute: 0 10*3/uL (ref 0.0–0.1)
Basophils Relative: 0.5 % (ref 0.0–3.0)
Eosinophils Absolute: 0 10*3/uL (ref 0.0–0.7)
Eosinophils Relative: 0.6 % (ref 0.0–5.0)
HCT: 42.2 % (ref 39.0–52.0)
Hemoglobin: 13.8 g/dL (ref 13.0–17.0)
Lymphocytes Relative: 29.3 % (ref 12.0–46.0)
Lymphs Abs: 1.3 10*3/uL (ref 0.7–4.0)
MCHC: 32.7 g/dL (ref 30.0–36.0)
MCV: 89.3 fl (ref 78.0–100.0)
Monocytes Absolute: 0.5 10*3/uL (ref 0.1–1.0)
Monocytes Relative: 10.4 % (ref 3.0–12.0)
Neutro Abs: 2.7 10*3/uL (ref 1.4–7.7)
Neutrophils Relative %: 59.2 % (ref 43.0–77.0)
Platelets: 115 10*3/uL — ABNORMAL LOW (ref 150.0–400.0)
RBC: 4.73 Mil/uL (ref 4.22–5.81)
RDW: 15 % (ref 11.5–15.5)
WBC: 4.5 10*3/uL (ref 4.0–10.5)

## 2020-12-08 LAB — PSA: PSA: 2.46 ng/mL (ref 0.10–4.00)

## 2020-12-08 LAB — TSH: TSH: 1.37 u[IU]/mL (ref 0.35–5.50)

## 2020-12-08 LAB — BASIC METABOLIC PANEL
BUN: 16 mg/dL (ref 6–23)
CO2: 32 mEq/L (ref 19–32)
Calcium: 9.4 mg/dL (ref 8.4–10.5)
Chloride: 100 mEq/L (ref 96–112)
Creatinine, Ser: 0.95 mg/dL (ref 0.40–1.50)
GFR: 94.49 mL/min (ref >60.00)
Glucose, Bld: 96 mg/dL (ref 70–99)
Potassium: 3.4 mEq/L — ABNORMAL LOW (ref 3.5–5.1)
Sodium: 140 mEq/L (ref 135–145)

## 2020-12-08 LAB — HEPATIC FUNCTION PANEL
ALT: 17 U/L (ref 0–53)
AST: 19 U/L (ref 0–37)
Albumin: 4.5 g/dL (ref 3.5–5.2)
Alkaline Phosphatase: 46 U/L (ref 39–117)
Bilirubin, Direct: 0.1 mg/dL (ref 0.0–0.3)
Total Bilirubin: 0.6 mg/dL (ref 0.2–1.2)
Total Protein: 7.9 g/dL (ref 6.0–8.3)

## 2020-12-08 LAB — LIPID PANEL
Cholesterol: 173 mg/dL (ref 0–200)
HDL: 54 mg/dL (ref >39.00)
LDL Cholesterol: 103 mg/dL — ABNORMAL HIGH (ref 0–99)
NonHDL: 118.93
Total CHOL/HDL Ratio: 3
Triglycerides: 80 mg/dL (ref 0.0–149.0)
VLDL: 16 mg/dL (ref 0.0–40.0)

## 2020-12-08 LAB — HEMOGLOBIN A1C: Hgb A1c MFr Bld: 6.1 % (ref 4.6–6.5)

## 2020-12-08 NOTE — Progress Notes (Signed)
   Subjective:    Patient ID: Reginald Simmons, male    DOB: 07-15-1970, 50 y.o.   MRN: [id number]  HPI Here for a well exam. He feels fine. He has dropped a little weight by eating a healthy diet and working out.   Review of Systems  Constitutional: Negative.   HENT: Negative.    Eyes: Negative.   Respiratory: Negative.    Cardiovascular: Negative.   Gastrointestinal: Negative.   Genitourinary: Negative.   Musculoskeletal: Negative.   Skin: Negative.   Neurological: Negative.   Psychiatric/Behavioral: Negative.        Objective:   Physical Exam Constitutional:      General: He is not in acute distress.    Appearance: Normal appearance. He is well-developed. He is not diaphoretic.  HENT:     Head: Normocephalic and atraumatic.     Right Ear: External ear normal.     Left Ear: External ear normal.     Nose: Nose normal.     Mouth/Throat:     Pharynx: No oropharyngeal exudate.  Eyes:     General: No scleral icterus.       Right eye: No discharge.        Left eye: No discharge.     Conjunctiva/sclera: Conjunctivae normal.     Pupils: Pupils are equal, round, and reactive to light.  Neck:     Thyroid: No thyromegaly.     Vascular: No JVD.     Trachea: No tracheal deviation.  Cardiovascular:     Rate and Rhythm: Normal rate and regular rhythm.     Heart sounds: Normal heart sounds. No murmur heard.   No friction rub. No gallop.  Pulmonary:     Effort: Pulmonary effort is normal. No respiratory distress.     Breath sounds: Normal breath sounds. No wheezing or rales.  Chest:     Chest wall: No tenderness.  Abdominal:     General: Bowel sounds are normal. There is no distension.     Palpations: Abdomen is soft. There is no mass.     Tenderness: There is no abdominal tenderness. There is no guarding or rebound.  Genitourinary:    Penis: Normal. No tenderness.      Testes: Normal.     Prostate: Normal.     Rectum: Normal. Guaiac result negative.  Musculoskeletal:         General: No tenderness. Normal range of motion.     Cervical back: Neck supple.  Lymphadenopathy:     Cervical: No cervical adenopathy.  Skin:    General: Skin is warm and dry.     Coloration: Skin is not pale.     Findings: No erythema or rash.  Neurological:     Mental Status: He is alert and oriented to person, place, and time.     Cranial Nerves: No cranial nerve deficit.     Motor: No abnormal muscle tone.     Coordination: Coordination normal.     Deep Tendon Reflexes: Reflexes are normal and symmetric. Reflexes normal.  Psychiatric:        Behavior: Behavior normal.        Thought Content: Thought content normal.        Judgment: Judgment normal.          Assessment & Plan:  Well exam. We discussed diet and exercise. Get fasting labs.  Alysia Penna, MD

## 2020-12-12 MED ORDER — POTASSIUM CHLORIDE ER 10 MEQ PO TBCR: 10.0000 meq | EXTENDED_RELEASE_TABLET | Freq: Every day | ORAL | 3 refills | Status: DC

## 2020-12-12 NOTE — Addendum Note (Signed)
Addended by: Agnes Lawrence on: 12/12/2020 08:35 AM   Modules accepted: Orders

## 2021-04-27 ENCOUNTER — Other Ambulatory Visit: Payer: Self-pay

## 2021-04-27 ENCOUNTER — Telehealth: Payer: Self-pay | Admitting: Family Medicine

## 2021-04-27 DIAGNOSIS — I10 Essential (primary) hypertension: Secondary | ICD-10-CM

## 2021-04-27 MED ORDER — POTASSIUM CHLORIDE ER 10 MEQ PO TBCR: 10.0000 meq | EXTENDED_RELEASE_TABLET | Freq: Every day | ORAL | 0 refills | Status: DC

## 2021-04-27 MED ORDER — AMLODIPINE BESYLATE 10 MG PO TABS: 10.0000 mg | ORAL_TABLET | Freq: Every day | ORAL | 0 refills | Status: DC

## 2021-04-27 MED ORDER — ROSUVASTATIN CALCIUM 40 MG PO TABS: 40.0000 mg | ORAL_TABLET | Freq: Every day | ORAL | 0 refills | Status: DC

## 2021-04-27 MED ORDER — CHLORTHALIDONE 25 MG PO TABS: 25.0000 mg | ORAL_TABLET | Freq: Every day | ORAL | 0 refills | Status: DC

## 2021-04-27 NOTE — Telephone Encounter (Signed)
[facility] [phone] - [place], [place] - [place] Phone:  [phone]  Fax:  [phone]

## 2021-04-27 NOTE — Telephone Encounter (Signed)
Pt call and stated he need all his prescription for 90 days sent to

## 2021-04-27 NOTE — Telephone Encounter (Signed)
Requested refills has been sent to [facility]

## 2021-07-02 ENCOUNTER — Other Ambulatory Visit: Payer: Self-pay | Admitting: Interventional Cardiology

## 2021-07-09 NOTE — Progress Notes (Signed)
?  ?Cardiology Office Note ? ? ?Date:  07/10/2021  ? ?ID:  Reginald Simmons, DOB 04-08-1970, MRN [id number] ? ?PCP:  Laurey Morale, MD  ? ? ?No chief complaint on file. ? ?Family h/o CAD ? ?Wt Readings from Last 3 Encounters:  ?07/10/21 300 lb (136.1 kg)  ?12/08/20 282 lb (127.9 kg)  ?10/26/20 293 lb (132.9 kg)  ?  ? ?  ?History of Present Illness: ?Reginald Simmons is a 51 y.o. male  had COVID in July 2020.  He had diarrhea, chills, fever, body aches, cough, congestion.  No shortness of breath.  He was able to stay home.   ?  ?He had some palpitations about 1 months ago.  He had some lightheadedness.  He called EMS.   ?  ?In the past: " His mother apparently had coronary disease in her 49s.  Father had a stroke around age 36.  He has 4 siblings and none of them have heart disease.  No history of smoking.  No diabetes history. " ?  ?He does a lot of walking at work.  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   ?  ?2022:Of late, he works out 4x/week at work.  Tries to eat healthy.  ?Had some atypical chest pain with certain chest movements, but this has resolved. "  ? ?Had long COVID so had to stop welding.  Now working in [employer].  ? ?Denies : Chest pain. Dizziness. Leg edema. Nitroglycerin use. Orthopnea. Palpitations. Paroxysmal nocturnal dyspnea. Shortness of breath. Syncope.   ? ?15K-20K steps per day.  Job is physical.  ? ?Gained weight over the past 4 moths when work gym closed down.  He still eats a lot of plant products. Eating out more.  ? ? ? ?Past Medical History:  ?Diagnosis Date  ? Allergy   ? GERD (gastroesophageal reflux disease)   ? Hyperlipidemia   ? Onychomycosis   ? ? ?History reviewed. No pertinent surgical history. ? ? ?Current Outpatient Medications  ?Medication Sig Dispense Refill  ? amLODipine (NORVASC) 10 MG tablet Take 1 tablet (10 mg total) by mouth daily. 90 tablet 0  ? cetirizine (ZYRTEC) 10 MG tablet Take  10 mg by mouth daily.    ? chlorthalidone (HYGROTON) 25 MG tablet Take 1 tablet (25 mg total) by mouth daily. 90 tablet 0  ? rosuvastatin (CRESTOR) 40 MG tablet Take 1 tablet (40 mg total) by mouth daily. Please keep upcoming appt for future refills. 30 tablet 0  ? polyvinyl alcohol (LIQUIFILM TEARS) 1.4 % ophthalmic solution Place 1 drop into both eyes as needed for dry eyes. (Patient not taking: Reported on 07/10/2021)    ? potassium chloride (KLOR-CON 10) 10 MEQ tablet Take 1 tablet (10 mEq total) by mouth daily. (Patient not taking: Reported on 07/10/2021) 90 tablet 0  ? ?No current facility-administered medications for this visit.  ? ? ?Allergies:   Patient has no known allergies.  ? ? ?Social History:  The patient  reports that he quit smoking about 26 years ago. His smoking use included cigarettes. He has a 0.50 pack-year smoking history. He has never used smokeless tobacco. He reports that he does not drink alcohol and does not use drugs.  ? ?Family History:  The patient's family history includes Heart disease in an other family member; Hypertension in an other family member; Stroke in an other family  member.  ? ? ?ROS:  Please see the history of present illness.   Otherwise, review of systems are positive for weight gain.   All other systems are reviewed and negative.  ? ? ?PHYSICAL EXAM: ?VS:  BP 126/80   Pulse 71   Ht '6\' 1"'$  (1.854 m)   Wt 300 lb (136.1 kg)   SpO2 95%   BMI 39.58 kg/m?  , BMI Body mass index is 39.58 kg/m?. ?GEN: Well nourished, well developed, in no acute distress ?HEENT: normal ?Neck: no JVD, carotid bruits, or masses ?Cardiac: RRR; no murmurs, rubs, or gallops,no edema  ?Respiratory:  clear to auscultation bilaterally, normal work of breathing ?GI: soft, nontender, nondistended, + BS ?MS: no deformity or atrophy ?Skin: warm and dry, no rash ?Neuro:  Strength and sensation are intact ?Psych: euthymic mood, full affect ? ? ?EKG:   ?The ekg ordered today demonstrates NSR, no ST  changes ? ? ?Recent Labs: ?12/08/2020: ALT 17; BUN 16; Creatinine, Ser 0.95; Hemoglobin 13.8; Platelets 115.0; Potassium 3.4; Sodium 140; TSH 1.37  ? ?Lipid Panel ?   ?Component Value Date/Time  ? CHOL 173 12/08/2020 1506  ? CHOL 160 10/13/2020 1419  ? TRIG 80.0 12/08/2020 1506  ? TRIG 100 12/13/2005 1034  ? HDL 54.00 12/08/2020 1506  ? HDL 58 10/13/2020 1419  ? CHOLHDL 3 12/08/2020 1506  ? VLDL 16.0 12/08/2020 1506  ? LDLCALC 103 (H) 12/08/2020 1506  ? [place] 85 10/13/2020 1419  ? [place] 73 12/03/2019 1103  ? LDLDIRECT 201.4 09/18/2012 0957  ? ?  ?Other studies Reviewed: ?Additional studies/ records that were reviewed today with results demonstrating: labs reviewed. ? ? ?ASSESSMENT AND PLAN: ? ?Family h/o CAD: We discussed lifestyle efforts at heart healthy including regular exercise and healthy diet.  Whole food, plant-based diet.  Avoid processed foods. ?Hyperlipidemia: Continue rosuvastatin.  LDL 103 in 2022. ?HTN: He was seen in the hypertension Pharm.D. clinic back in August 2022.  Blood pressure controlled on amlodipine and chlorthalidone.  Lifestyle modifications stressed.  BP readings at home have been controlled.  ?Palpitations: noted in the past.   ?Morbid obesity: Avoid processed foods and heavily fried foods.  He needs activity at the target noted below at least. ?PreDM: A1c 6.1.  Whole food, plant-based diet recommended.  High-fiber diet.  Avoid processed foods. ? ? ?Current medicines are reviewed at length with the patient today.  The patient concerns regarding his medicines were addressed. ? ?The following changes have been made:  No change ? ?Labs/ tests ordered today include:  ?No orders of the defined types were placed in this encounter. ? ? ?Recommend 150 minutes/week of aerobic exercise ?Low fat, low carb, high fiber diet recommended ? ?Disposition:   FU in 1 year ? ? ?Signed, ?Larae Grooms, MD  ?07/10/2021 2:24 PM    ?[facility] [place] ?[place], [place], [place]   [address] ?Phone: [phone]; Fax: [phone]  ? ?

## 2021-07-10 ENCOUNTER — Ambulatory Visit (INDEPENDENT_AMBULATORY_CARE_PROVIDER_SITE_OTHER): Payer: Managed Care, Other (non HMO) | Admitting: Interventional Cardiology

## 2021-07-10 ENCOUNTER — Encounter: Payer: Self-pay | Admitting: Interventional Cardiology

## 2021-07-10 VITALS — BP 126/80 | HR 71 | Ht 73.0 in | Wt 300.0 lb

## 2021-07-10 DIAGNOSIS — Z8249 Family history of ischemic heart disease and other diseases of the circulatory system: Secondary | ICD-10-CM

## 2021-07-10 DIAGNOSIS — I10 Essential (primary) hypertension: Secondary | ICD-10-CM

## 2021-07-10 DIAGNOSIS — E782 Mixed hyperlipidemia: Secondary | ICD-10-CM

## 2021-07-10 NOTE — Patient Instructions (Addendum)
Medication Instructions:  ?Your physician recommends that you continue on your current medications as directed. Please refer to the Current Medication list given to you today. ? ?*If you need a refill on your cardiac medications before your next appointment, please call your pharmacy* ? ? ?Lab Work: ?Lab work to be done today--BMP ?If you have labs (blood work) drawn today and your tests are completely normal, you will receive your results only by: ?MyChart Message (if you have MyChart) OR ?A paper copy in the mail ?If you have any lab test that is abnormal or we need to change your treatment, we will call you to review the results. ? ? ?Testing/Procedures: ?none ? ? ?Follow-Up: ?At [facility], you and your health needs are our priority.  As part of our continuing mission to provide you with exceptional heart care, we have created designated Provider Care Teams.  These Care Teams include your primary Cardiologist (physician) and Advanced Practice Providers (APPs -  Physician Assistants and Nurse Practitioners) who all work together to provide you with the care you need, when you need it. ? ?We recommend signing up for the patient portal called "MyChart".  Sign up information is provided on this After Visit Summary.  MyChart is used to connect with patients for Virtual Visits (Telemedicine).  Patients are able to view lab/test results, encounter notes, upcoming appointments, etc.  Non-urgent messages can be sent to your provider as well.   ?To learn more about what you can do with MyChart, go to [email or web address].   ? ?Your next appointment:   ?12 month(s) ? ?The format for your next appointment:   ?In Person ? ?Provider:   ?Larae Grooms, MD   ? ? ?Other Instructions ?  ? ?Important Information About Sugar ? ? ? ? ? ? ?

## 2021-07-11 LAB — BASIC METABOLIC PANEL
BUN/Creatinine Ratio: 16 (ref 9–20)
BUN: 17 mg/dL (ref 6–24)
CO2: 26 mmol/L (ref 20–29)
Calcium: 9.2 mg/dL (ref 8.7–10.2)
Chloride: 100 mmol/L (ref 96–106)
Creatinine, Ser: 1.06 mg/dL (ref 0.76–1.27)
Glucose: 96 mg/dL (ref 70–99)
Potassium: 3.9 mmol/L (ref 3.5–5.2)
Sodium: 140 mmol/L (ref 134–144)
eGFR: 86 mL/min/{1.73_m2} (ref >59)

## 2021-08-25 ENCOUNTER — Other Ambulatory Visit: Payer: Self-pay | Admitting: Family Medicine

## 2021-08-25 DIAGNOSIS — I10 Essential (primary) hypertension: Secondary | ICD-10-CM

## 2021-09-09 IMAGING — DX DG CHEST 2V
2 series · 2 of 2 positions shown · non-contrast
Comparison: 09/27/2018

CLINICAL DATA: Chest pain. Hypertensive. Headache and
lightheadedness.

EXAM:
CHEST - 2 VIEW

[chest pa]
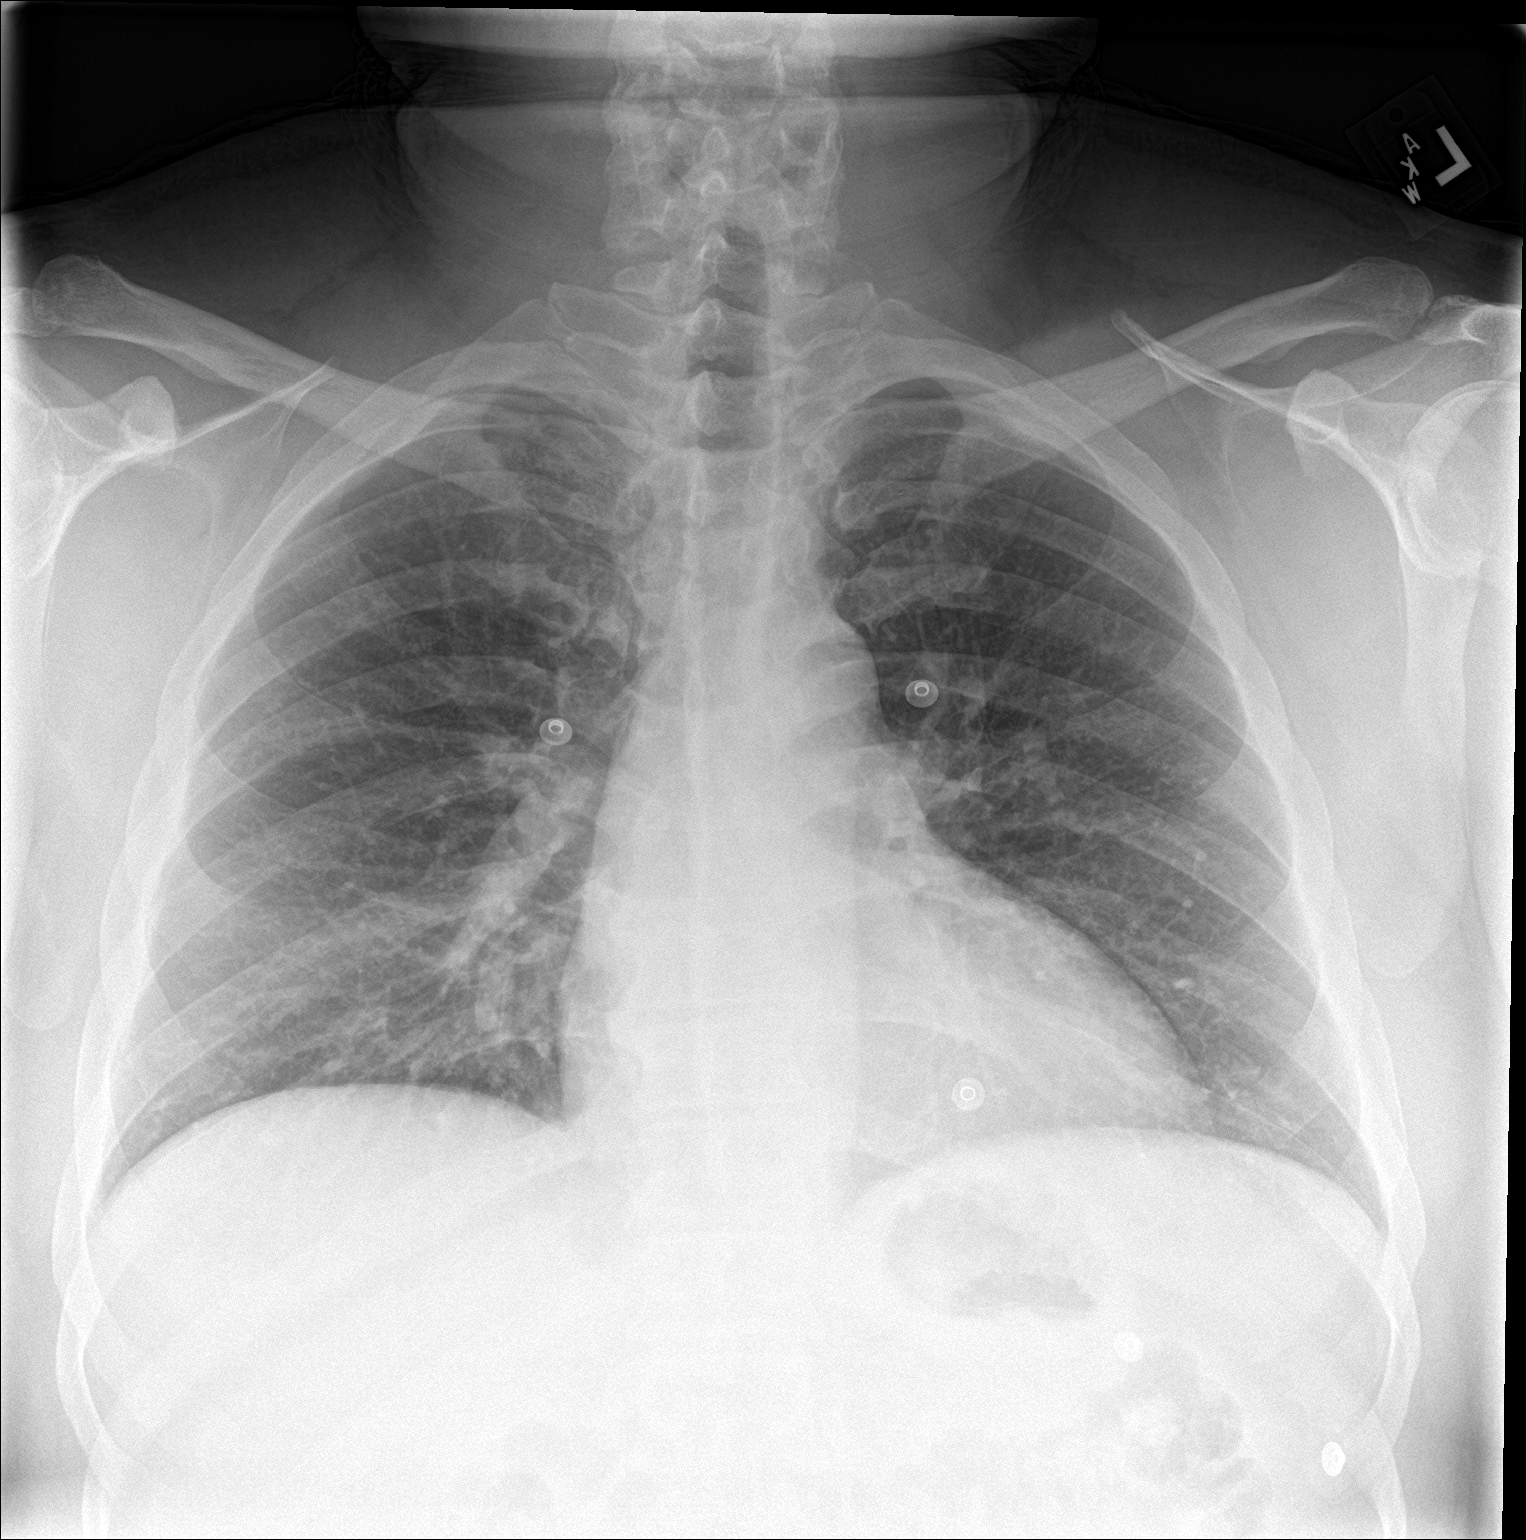

[chest lat]
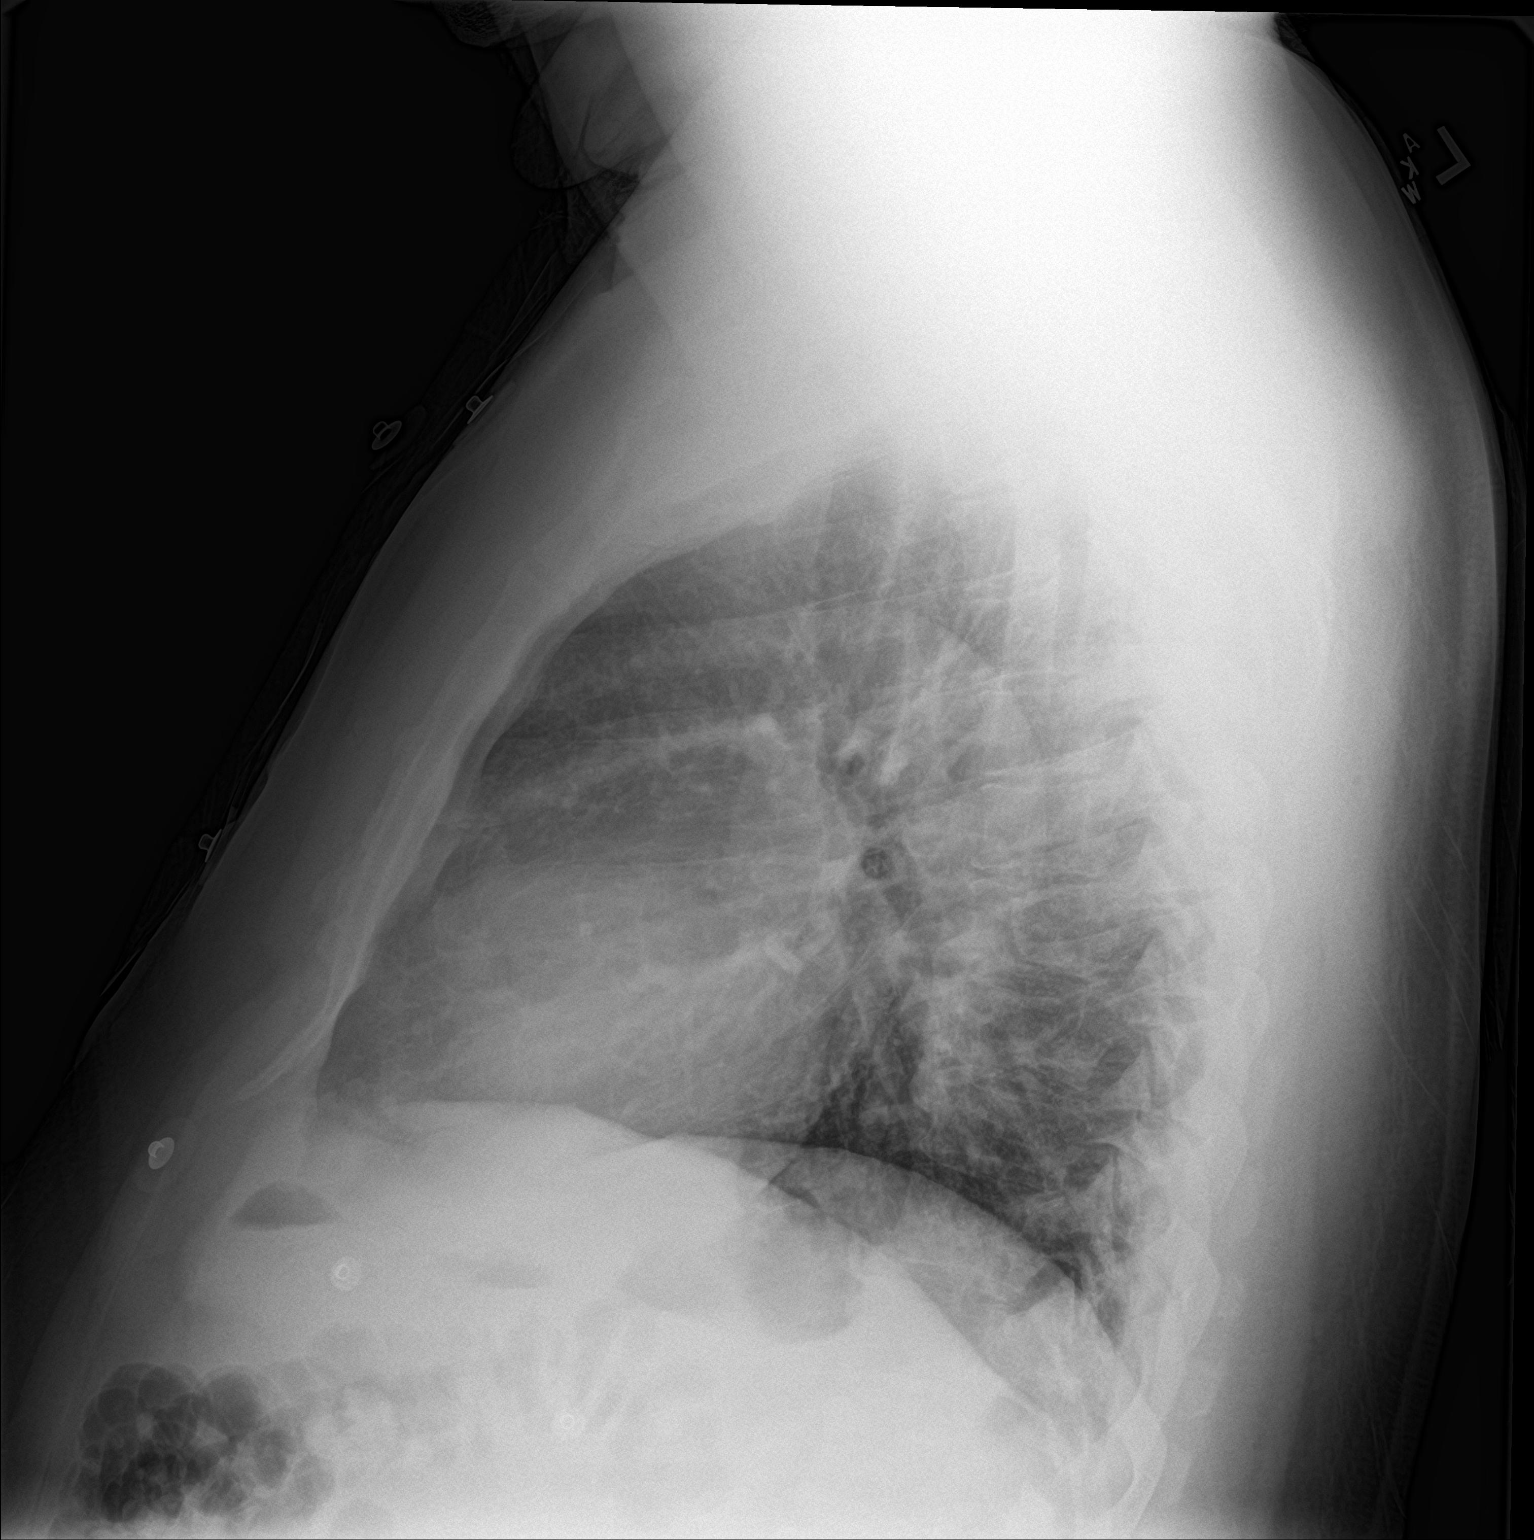

[2 of 2 positions shown; findings below may reference images not displayed]

FINDINGS: Upper normal heart size.The cardiomediastinal contours are normal.
Redemonstrated calcified granuloma in the left lung. Pulmonary
vasculature is normal. No consolidation, pleural effusion, or
pneumothorax. No acute osseous abnormalities are seen.
IMPRESSION: No acute chest findings.

## 2021-12-14 ENCOUNTER — Ambulatory Visit (INDEPENDENT_AMBULATORY_CARE_PROVIDER_SITE_OTHER): Payer: 59 | Admitting: Family Medicine

## 2021-12-14 ENCOUNTER — Encounter: Payer: Self-pay | Admitting: Family Medicine

## 2021-12-14 VITALS — BP 110/74 | HR 60 | Temp 98.5°F | Ht 73.0 in | Wt 298.0 lb

## 2021-12-14 DIAGNOSIS — I10 Essential (primary) hypertension: Secondary | ICD-10-CM

## 2021-12-14 DIAGNOSIS — Z0000 Encounter for general adult medical examination without abnormal findings: Secondary | ICD-10-CM

## 2021-12-14 DIAGNOSIS — D696 Thrombocytopenia, unspecified: Secondary | ICD-10-CM | POA: Diagnosis not present

## 2021-12-14 LAB — LIPID PANEL
Cholesterol: 175 mg/dL (ref 0–200)
HDL: 56.6 mg/dL (ref >39.00)
LDL Cholesterol: 105 mg/dL — ABNORMAL HIGH (ref 0–99)
NonHDL: 118.34
Total CHOL/HDL Ratio: 3
Triglycerides: 68 mg/dL (ref 0.0–149.0)
VLDL: 13.6 mg/dL (ref 0.0–40.0)

## 2021-12-14 LAB — BASIC METABOLIC PANEL
BUN: 19 mg/dL (ref 6–23)
CO2: 30 mEq/L (ref 19–32)
Calcium: 9.2 mg/dL (ref 8.4–10.5)
Chloride: 102 mEq/L (ref 96–112)
Creatinine, Ser: 1.01 mg/dL (ref 0.40–1.50)
GFR: 87.17 mL/min (ref >60.00)
Glucose, Bld: 95 mg/dL (ref 70–99)
Potassium: 3.6 mEq/L (ref 3.5–5.1)
Sodium: 140 mEq/L (ref 135–145)

## 2021-12-14 LAB — CBC WITH DIFFERENTIAL/PLATELET
Basophils Absolute: 0 10*3/uL (ref 0.0–0.1)
Basophils Relative: 0.4 % (ref 0.0–3.0)
Eosinophils Absolute: 0 10*3/uL (ref 0.0–0.7)
Eosinophils Relative: 1.1 % (ref 0.0–5.0)
HCT: 42.5 % (ref 39.0–52.0)
Hemoglobin: 14.2 g/dL (ref 13.0–17.0)
Lymphocytes Relative: 22 % (ref 12.0–46.0)
Lymphs Abs: 0.9 10*3/uL (ref 0.7–4.0)
MCHC: 33.4 g/dL (ref 30.0–36.0)
MCV: 88.1 fl (ref 78.0–100.0)
Monocytes Absolute: 0.4 10*3/uL (ref 0.1–1.0)
Monocytes Relative: 9.7 % (ref 3.0–12.0)
Neutro Abs: 2.8 10*3/uL (ref 1.4–7.7)
Neutrophils Relative %: 66.8 % (ref 43.0–77.0)
Platelets: 119 10*3/uL — ABNORMAL LOW (ref 150.0–400.0)
RBC: 4.82 Mil/uL (ref 4.22–5.81)
RDW: 15.6 % — ABNORMAL HIGH (ref 11.5–15.5)
WBC: 4.3 10*3/uL (ref 4.0–10.5)

## 2021-12-14 LAB — HEPATIC FUNCTION PANEL
ALT: 22 U/L (ref 0–53)
AST: 21 U/L (ref 0–37)
Albumin: 4.3 g/dL (ref 3.5–5.2)
Alkaline Phosphatase: 42 U/L (ref 39–117)
Bilirubin, Direct: 0.1 mg/dL (ref 0.0–0.3)
Total Bilirubin: 0.6 mg/dL (ref 0.2–1.2)
Total Protein: 7.7 g/dL (ref 6.0–8.3)

## 2021-12-14 LAB — TSH: TSH: 0.93 u[IU]/mL (ref 0.35–5.50)

## 2021-12-14 LAB — HEMOGLOBIN A1C: Hgb A1c MFr Bld: 6.1 % (ref 4.6–6.5)

## 2021-12-14 LAB — PSA: PSA: 2.49 ng/mL (ref 0.10–4.00)

## 2021-12-14 MED ORDER — ROSUVASTATIN CALCIUM 40 MG PO TABS: 40.0000 mg | ORAL_TABLET | Freq: Every day | ORAL | 3 refills | Status: DC

## 2021-12-14 MED ORDER — CHLORTHALIDONE 25 MG PO TABS: 25.0000 mg | ORAL_TABLET | Freq: Every day | ORAL | 3 refills | Status: DC

## 2021-12-14 MED ORDER — AMLODIPINE BESYLATE 10 MG PO TABS: 10.0000 mg | ORAL_TABLET | Freq: Every day | ORAL | 3 refills | Status: DC

## 2021-12-14 NOTE — Progress Notes (Signed)
   Subjective:    Patient ID: Reginald Simmons, male    DOB: 05-Apr-1970, 51 y.o.   MRN: [id number]  HPI Here for a well exam. He feels well. He has recently joined a gym, and he has committed himself to losing weight.    Review of Systems  Constitutional: Negative.   HENT: Negative.    Eyes: Negative.   Respiratory: Negative.    Cardiovascular: Negative.   Gastrointestinal: Negative.   Genitourinary: Negative.   Musculoskeletal: Negative.   Skin: Negative.   Neurological: Negative.   Psychiatric/Behavioral: Negative.         Objective:   Physical Exam Constitutional:      General: He is not in acute distress.    Appearance: Normal appearance. He is well-developed. He is not diaphoretic.  HENT:     Head: Normocephalic and atraumatic.     Right Ear: External ear normal.     Left Ear: External ear normal.     Nose: Nose normal.     Mouth/Throat:     Pharynx: No oropharyngeal exudate.  Eyes:     General: No scleral icterus.       Right eye: No discharge.        Left eye: No discharge.     Conjunctiva/sclera: Conjunctivae normal.     Pupils: Pupils are equal, round, and reactive to light.  Neck:     Thyroid: No thyromegaly.     Vascular: No JVD.     Trachea: No tracheal deviation.  Cardiovascular:     Rate and Rhythm: Normal rate and regular rhythm.     Heart sounds: Normal heart sounds. No murmur heard.    No friction rub. No gallop.  Pulmonary:     Effort: Pulmonary effort is normal. No respiratory distress.     Breath sounds: Normal breath sounds. No wheezing or rales.  Chest:     Chest wall: No tenderness.  Abdominal:     General: Bowel sounds are normal. There is no distension.     Palpations: Abdomen is soft. There is no mass.     Tenderness: There is no abdominal tenderness. There is no guarding or rebound.  Genitourinary:    Penis: Normal. No tenderness.      Testes: Normal.     Prostate: Normal.     Rectum: Normal. Guaiac result negative.   Musculoskeletal:        General: No tenderness. Normal range of motion.     Cervical back: Neck supple.  Lymphadenopathy:     Cervical: No cervical adenopathy.  Skin:    General: Skin is warm and dry.     Coloration: Skin is not pale.     Findings: No erythema or rash.  Neurological:     Mental Status: He is alert and oriented to person, place, and time.     Cranial Nerves: No cranial nerve deficit.     Motor: No abnormal muscle tone.     Coordination: Coordination normal.     Deep Tendon Reflexes: Reflexes are normal and symmetric. Reflexes normal.  Psychiatric:        Behavior: Behavior normal.        Thought Content: Thought content normal.        Judgment: Judgment normal.           Assessment & Plan:  Well exam. We discussed diet and exercise. Get fasting labs. Alysia Penna, MD

## 2021-12-14 NOTE — Addendum Note (Signed)
Addended by: Alysia Penna A on: 12/14/2021 04:23 PM   Modules accepted: Orders

## 2021-12-17 ENCOUNTER — Encounter: Payer: Self-pay | Admitting: Gastroenterology

## 2021-12-26 ENCOUNTER — Other Ambulatory Visit: Payer: Self-pay | Admitting: *Deleted

## 2021-12-26 DIAGNOSIS — D696 Thrombocytopenia, unspecified: Secondary | ICD-10-CM

## 2021-12-26 NOTE — Progress Notes (Signed)
Lab orders entered for New patient appt

## 2021-12-28 NOTE — Progress Notes (Unsigned)
New Hematology/Oncology Consult   Requesting MD: Dr. Alysia Penna  [phone]      Reason for Consult: Thrombocytopenia  HPI: [name] is a 51 year old man referred for evaluation of thrombocytopenia.  He had a CBC checked at the time of a well exam on 12/14/2021.  The platelet count returned at 119,000.  Review of multiple CBCs in the EMR show consistent thrombocytopenia dating to 12/13/2005.     Past Medical History:  Diagnosis Date   Allergy    GERD (gastroesophageal reflux disease)    Hyperlipidemia    Onychomycosis     No past surgical history on file.:   Current Outpatient Medications:    amLODipine (NORVASC) 10 MG tablet, Take 1 tablet (10 mg total) by mouth daily., Disp: 90 tablet, Rfl: 3   cetirizine (ZYRTEC) 10 MG tablet, Take 10 mg by mouth daily., Disp: , Rfl:    chlorthalidone (HYGROTON) 25 MG tablet, Take 1 tablet (25 mg total) by mouth daily., Disp: 90 tablet, Rfl: 3   polyvinyl alcohol (LIQUIFILM TEARS) 1.4 % ophthalmic solution, Place 1 drop into both eyes as needed for dry eyes., Disp: , Rfl:    rosuvastatin (CRESTOR) 40 MG tablet, Take 1 tablet (40 mg total) by mouth daily. Please keep upcoming appt for future refills., Disp: 90 tablet, Rfl: 3:    No Known Allergies:  FH:  SOCIAL HISTORY:  Review of Systems:  Positives include:  A complete ROS was otherwise negative.   Physical Exam:  There were no vitals taken for this visit.  HEENT: *** Lungs: *** Cardiac: *** Abdomen: *** GU: ***  Vascular: *** Lymph nodes: *** Neurologic: *** Skin: *** Musculoskeletal: ***  LABS:  No results for input(s): "WBC", "HGB", "HCT", "PLT" in the last 72 hours.  No results for input(s): "NA", "K", "CL", "CO2", "GLUCOSE", "BUN", "CREATININE", "CALCIUM" in the last 72 hours.    RADIOLOGY:  No results found.  Assessment and Plan:   ***    Ned Card, NP 12/28/2021, 2:16 PM

## 2021-12-31 ENCOUNTER — Inpatient Hospital Stay (HOSPITAL_BASED_OUTPATIENT_CLINIC_OR_DEPARTMENT_OTHER): Payer: 59 | Admitting: Nurse Practitioner

## 2021-12-31 ENCOUNTER — Inpatient Hospital Stay: Payer: 59 | Attending: Oncology

## 2021-12-31 ENCOUNTER — Encounter: Payer: Self-pay | Admitting: Nurse Practitioner

## 2021-12-31 VITALS — BP 128/78 | HR 72 | Temp 98.2°F | Resp 18 | Wt 298.4 lb

## 2021-12-31 DIAGNOSIS — D696 Thrombocytopenia, unspecified: Secondary | ICD-10-CM

## 2021-12-31 DIAGNOSIS — Z87891 Personal history of nicotine dependence: Secondary | ICD-10-CM | POA: Insufficient documentation

## 2021-12-31 DIAGNOSIS — Z823 Family history of stroke: Secondary | ICD-10-CM | POA: Diagnosis not present

## 2021-12-31 DIAGNOSIS — I10 Essential (primary) hypertension: Secondary | ICD-10-CM | POA: Diagnosis not present

## 2021-12-31 DIAGNOSIS — Z79899 Other long term (current) drug therapy: Secondary | ICD-10-CM | POA: Insufficient documentation

## 2021-12-31 DIAGNOSIS — L732 Hidradenitis suppurativa: Secondary | ICD-10-CM | POA: Insufficient documentation

## 2021-12-31 DIAGNOSIS — Z8249 Family history of ischemic heart disease and other diseases of the circulatory system: Secondary | ICD-10-CM | POA: Insufficient documentation

## 2021-12-31 DIAGNOSIS — E7800 Pure hypercholesterolemia, unspecified: Secondary | ICD-10-CM | POA: Insufficient documentation

## 2021-12-31 LAB — CBC WITH DIFFERENTIAL (CANCER CENTER ONLY)
Abs Immature Granulocytes: 0.01 10*3/uL (ref 0.00–0.07)
Basophils Absolute: 0 10*3/uL (ref 0.0–0.1)
Basophils Relative: 0 %
Eosinophils Absolute: 0 10*3/uL (ref 0.0–0.5)
Eosinophils Relative: 0 %
HCT: 43.2 % (ref 39.0–52.0)
Hemoglobin: 14.4 g/dL (ref 13.0–17.0)
Immature Granulocytes: 0 %
Lymphocytes Relative: 15 %
Lymphs Abs: 0.8 10*3/uL (ref 0.7–4.0)
MCH: 29.2 pg (ref 26.0–34.0)
MCHC: 33.3 g/dL (ref 30.0–36.0)
MCV: 87.6 fL (ref 80.0–100.0)
Monocytes Absolute: 0.4 10*3/uL (ref 0.1–1.0)
Monocytes Relative: 8 %
Neutro Abs: 4.1 10*3/uL (ref 1.7–7.7)
Neutrophils Relative %: 77 %
Platelet Count: 131 10*3/uL — ABNORMAL LOW (ref 150–400)
RBC: 4.93 MIL/uL (ref 4.22–5.81)
RDW: 15 % (ref 11.5–15.5)
WBC Count: 5.4 10*3/uL (ref 4.0–10.5)
nRBC: 0 % (ref 0.0–0.2)

## 2021-12-31 LAB — SAVE SMEAR(SSMR), FOR PROVIDER SLIDE REVIEW

## 2022-01-11 ENCOUNTER — Encounter: Payer: Self-pay | Admitting: Gastroenterology

## 2022-01-11 ENCOUNTER — Other Ambulatory Visit: Payer: Self-pay

## 2022-01-11 ENCOUNTER — Ambulatory Visit (AMBULATORY_SURGERY_CENTER): Payer: Self-pay | Admitting: *Deleted

## 2022-01-11 VITALS — Ht 73.0 in | Wt 290.0 lb

## 2022-01-11 DIAGNOSIS — Z1211 Encounter for screening for malignant neoplasm of colon: Secondary | ICD-10-CM

## 2022-01-11 MED ORDER — NA SULFATE-K SULFATE-MG SULF 17.5-3.13-1.6 GM/177ML PO SOLN: 1.0000 | Freq: Once | ORAL | 0 refills | Status: AC

## 2022-01-11 NOTE — Progress Notes (Signed)
Pre visit completed in person  No egg or soy allergy known to patient  No issues known to pt with past sedation with any surgeries or procedures Patient denies ever being told they had issues or difficulty with intubation  No FH of Malignant Hyperthermia Pt is not on diet pills Pt is not on  home 02  Pt is not on blood thinners  Pt denies issues with constipation  No A fib or A flutter   Pt instructed to use [employer] or GoodRx for a price reduction on prep  coupon provided

## 2022-01-16 ENCOUNTER — Encounter: Payer: Self-pay | Admitting: Gastroenterology

## 2022-01-16 ENCOUNTER — Ambulatory Visit (AMBULATORY_SURGERY_CENTER): Payer: 59 | Admitting: Gastroenterology

## 2022-01-16 VITALS — BP 131/75 | HR 55 | Temp 97.5°F | Resp 14 | Ht 73.0 in | Wt 290.0 lb

## 2022-01-16 DIAGNOSIS — Z1211 Encounter for screening for malignant neoplasm of colon: Secondary | ICD-10-CM | POA: Diagnosis present

## 2022-01-16 DIAGNOSIS — D125 Benign neoplasm of sigmoid colon: Secondary | ICD-10-CM

## 2022-01-16 HISTORY — PX: COLONOSCOPY: SHX174

## 2022-01-16 MED ORDER — SODIUM CHLORIDE 0.9 % IV SOLN: 500.0000 mL | INTRAVENOUS | Status: DC

## 2022-01-16 NOTE — Progress Notes (Signed)
Called to room to assist during endoscopic procedure.  Patient ID and intended procedure confirmed with present staff. Received instructions for my participation in the procedure from the performing physician.  

## 2022-01-16 NOTE — Progress Notes (Signed)
Vss nad trans to pacu °

## 2022-01-16 NOTE — Op Note (Signed)
[facility] Patient Name: Reginald Simmons Procedure Date: 01/16/2022 12:52 PM MRN: [id number] Endoscopist: Nicki Reaper E. Candis Schatz , MD, [id number] Age: 51 Referring MD:  Date of Birth: 1970/11/17 Gender: Male Account #: [id number] Procedure:                Colonoscopy Indications:              Screening for colorectal malignant neoplasm, This                            is the patient's first colonoscopy Medicines:                Monitored Anesthesia Care Procedure:                Pre-Anesthesia Assessment:                           - Prior to the procedure, a History and Physical                            was performed, and patient medications and                            allergies were reviewed. The patient's tolerance of                            previous anesthesia was also reviewed. The risks                            and benefits of the procedure and the sedation                            options and risks were discussed with the patient.                            All questions were answered, and informed consent                            was obtained. Prior Anticoagulants: The patient has                            taken no anticoagulant or antiplatelet agents. ASA                            Grade Assessment: II - A patient with mild systemic                            disease. After reviewing the risks and benefits,                            the patient was deemed in satisfactory condition to                            undergo the procedure.  After obtaining informed consent, the colonoscope                            was passed under direct vision. Throughout the                            procedure, the patient's blood pressure, pulse, and                            oxygen saturations were monitored continuously. The                            CF [id number] #[id number] was introduced through the anus                            and advanced to  the the terminal ileum, with                            identification of the appendiceal orifice and IC                            valve. The colonoscopy was performed without                            difficulty. The patient tolerated the procedure                            well. The quality of the bowel preparation was                            adequate. The terminal ileum, ileocecal valve,                            appendiceal orifice, and rectum were photographed.                            The bowel preparation used was SUPREP via split                            dose instruction. Scope In: 1:33:40 PM Scope Out: 1:51:51 PM Scope Withdrawal Time: 0 hours 14 minutes 36 seconds  Total Procedure Duration: 0 hours 18 minutes 11 seconds  Findings:                 The perianal and digital rectal examinations were                            normal. Pertinent negatives include normal                            sphincter tone and no palpable rectal lesions.                           An 18 mm polyp was found in the proximal sigmoid  colon. The polyp was pedunculated. The polyp was                            removed with a hot snare. Resection and retrieval                            were complete. Estimated blood loss was minimal.                           The exam was otherwise normal throughout the                            examined colon.                           The terminal ileum appeared normal.                           The retroflexed view of the distal rectum and anal                            verge was normal and showed no anal or rectal                            abnormalities. Complications:            No immediate complications. Estimated Blood Loss:     Estimated blood loss was minimal. Impression:               - One 18 mm polyp in the proximal sigmoid colon,                            removed with a hot snare. Resected and retrieved.                            - The examined portion of the ileum was normal.                           - The distal rectum and anal verge are normal on                            retroflexion view. Recommendation:           - Patient has a contact number available for                            emergencies. The signs and symptoms of potential                            delayed complications were discussed with the                            patient. Return to normal activities tomorrow.  Written discharge instructions were provided to the                            patient.                           - Resume previous diet.                           - Continue present medications.                           - Await pathology results.                           - Repeat colonoscopy (date not yet determined) for                            surveillance based on pathology results. Tiauna Whisnant E. Candis Schatz, MD 01/16/2022 2:01:38 PM This report has been signed electronically.

## 2022-01-16 NOTE — Progress Notes (Signed)
Pt's states no medical or surgical changes since previsit or office visit. 

## 2022-01-16 NOTE — Progress Notes (Signed)
[facility] History and Physical   Primary Care Physician:  Laurey Morale, MD   Reason for Procedure:   Colon cancer screening  Plan:    Screening colonoscopy     HPI: Reginald Simmons is a 51 y.o. male undergoing initial average risk screening colonoscopy.  He has no family history of colon cancer and no chronic GI symptoms.    Past Medical History:  Diagnosis Date   Allergy    GERD (gastroesophageal reflux disease)    Hyperlipidemia    Hypertension    Onychomycosis     No past surgical history on file.  Prior to Admission medications   Medication Sig Start Date End Date Taking? Authorizing Provider  amLODipine (NORVASC) 10 MG tablet Take 1 tablet (10 mg total) by mouth daily. 12/14/21  Yes Laurey Morale, MD  cetirizine (ZYRTEC) 10 MG tablet Take 10 mg by mouth daily.   Yes [provider]  chlorthalidone (HYGROTON) 25 MG tablet Take 1 tablet (25 mg total) by mouth daily. 12/14/21  Yes Laurey Morale, MD  rosuvastatin (CRESTOR) 40 MG tablet Take 1 tablet (40 mg total) by mouth daily. Please keep upcoming appt for future refills. 12/14/21  Yes Laurey Morale, MD    Current Outpatient Medications  Medication Sig Dispense Refill   amLODipine (NORVASC) 10 MG tablet Take 1 tablet (10 mg total) by mouth daily. 90 tablet 3   cetirizine (ZYRTEC) 10 MG tablet Take 10 mg by mouth daily.     chlorthalidone (HYGROTON) 25 MG tablet Take 1 tablet (25 mg total) by mouth daily. 90 tablet 3   rosuvastatin (CRESTOR) 40 MG tablet Take 1 tablet (40 mg total) by mouth daily. Please keep upcoming appt for future refills. 90 tablet 3   Current Facility-Administered Medications  Medication Dose Route Frequency Provider Last Rate Last Admin   0.9 %  sodium chloride infusion  500 mL Intravenous Continuous Daryel November, MD        Allergies as of 01/16/2022   (No Known Allergies)    Family History  Problem Relation Age of Onset   Hypertension Other    Stroke  Other    Heart disease Other    Colon cancer Neg Hx     Social History   Socioeconomic History   Marital status: Married    Spouse name: Not on file   Number of children: Not on file   Years of education: Not on file   Highest education level: Not on file  Occupational History   Not on file  Tobacco Use   Smoking status: Former    Packs/day: 0.25    Years: 2.00    Total pack years: 0.50    Types: Cigarettes    Quit date: 12/03/1994    Years since quitting: 27.1   Smokeless tobacco: Never  Vaping Use   Vaping Use: Never used  Substance and Sexual Activity   Alcohol use: Yes    Comment: rare   Drug use: No   Sexual activity: Not on file  Other Topics Concern   Not on file  Social History Narrative   Not on file   Social Determinants of Health   Financial Resource Strain: Not on file  Food Insecurity: Not on file  Transportation Needs: Not on file  Physical Activity: Not on file  Stress: Not on file  Social Connections: Not on file  Intimate Partner Violence: Not on file    Review of Systems:  All  other review of systems negative except as mentioned in the HPI.  Physical Exam: Vital signs BP 112/65   Pulse (!) 58   Temp (!) 97.5 F (36.4 C) (Temporal)   Ht '6\' 1"'$  (1.854 m)   Wt 290 lb (131.5 kg)   SpO2 97%   BMI 38.26 kg/m   General:   Alert,  Well-developed, well-nourished, pleasant and cooperative in NAD Airway:  Mallampati 2 Lungs:  Clear throughout to auscultation.   Heart:  Regular rate and rhythm; no murmurs, clicks, rubs,  or gallops. Abdomen:  Soft, nontender and nondistended. Normal bowel sounds.   Neuro/Psych:  Normal mood and affect. A and O x 3   Rudi Knippenberg E. Candis Schatz, MD [facility]

## 2022-01-16 NOTE — Patient Instructions (Signed)
Read all of the handouts given to you by your recovery room nurse.  Resume all of your medications as ordered.  YOU HAD AN ENDOSCOPIC PROCEDURE TODAY AT [facility]:   Refer to the procedure report that was given to you for any specific questions about what was found during the examination.  If the procedure report does not answer your questions, please call your gastroenterologist to clarify.  If you requested that your care partner not be given the details of your procedure findings, then the procedure report has been included in a sealed envelope for you to review at your convenience later.  YOU SHOULD EXPECT: Some feelings of bloating in the abdomen. Passage of more gas than usual.  Walking can help get rid of the air that was put into your GI tract during the procedure and reduce the bloating. If you had a lower endoscopy (such as a colonoscopy or flexible sigmoidoscopy) you may notice spotting of blood in your stool or on the toilet paper. If you underwent a bowel prep for your procedure, you may not have a normal bowel movement for a few days.  Please Note:  You might notice some irritation and congestion in your nose or some drainage.  This is from the oxygen used during your procedure.  There is no need for concern and it should clear up in a day or so.  SYMPTOMS TO REPORT IMMEDIATELY:  Following lower endoscopy (colonoscopy or flexible sigmoidoscopy):  Excessive amounts of blood in the stool  Significant tenderness or worsening of abdominal pains  Swelling of the abdomen that is new, acute  Fever of 100F or higher   For urgent or emergent issues, a gastroenterologist can be reached at any hour by calling [phone]. Do not use MyChart messaging for urgent concerns.    DIET:  We do recommend a small meal at first, but then you may proceed to your regular diet.  Drink plenty of fluids but you should avoid alcoholic beverages for 24 hours.  ACTIVITY:  You should  plan to take it easy for the rest of today and you should NOT DRIVE or use heavy machinery until tomorrow (because of the sedation medicines used during the test).    FOLLOW UP: Our staff will call the number listed on your records the next business day following your procedure.  We will call around 7:15- 8:00 am to check on you and address any questions or concerns that you may have regarding the information given to you following your procedure. If we do not reach you, we will leave a message.     If any biopsies were taken you will be contacted by phone or by letter within the next 1-3 weeks.  Please call us at [phone] if you have not heard about the biopsies in 3 weeks.    SIGNATURES/CONFIDENTIALITY: You and/or your care partner have signed paperwork which will be entered into your electronic medical record.  These signatures attest to the fact that that the information above on your After Visit Summary has been reviewed and is understood.  Full responsibility of the confidentiality of this discharge information lies with you and/or your care-partner.

## 2022-01-17 ENCOUNTER — Telehealth: Payer: Self-pay

## 2022-01-17 NOTE — Telephone Encounter (Signed)
  Follow up Call-     01/16/2022   12:58 PM  Call back number  Post procedure Call Back phone  # [phone]  Permission to leave phone message Yes     Patient questions:  Do you have a fever, pain , or abdominal swelling? No. Pain Score  0 *  Have you tolerated food without any problems? Yes.    Have you been able to return to your normal activities? Yes.    Do you have any questions about your discharge instructions: Diet   No. Medications  No. Follow up visit  No.  Do you have questions or concerns about your Care? No.  Actions: * If pain score is 4 or above: No action needed, pain <4.

## 2022-02-08 ENCOUNTER — Encounter: Payer: Managed Care, Other (non HMO) | Admitting: Gastroenterology

## 2022-08-02 ENCOUNTER — Ambulatory Visit: Payer: 59 | Attending: Physician Assistant | Admitting: Physician Assistant

## 2022-08-02 ENCOUNTER — Encounter: Payer: Self-pay | Admitting: Physician Assistant

## 2022-08-02 VITALS — BP 126/84 | HR 75 | Ht 73.0 in | Wt 294.6 lb

## 2022-08-02 DIAGNOSIS — Z8249 Family history of ischemic heart disease and other diseases of the circulatory system: Secondary | ICD-10-CM

## 2022-08-02 DIAGNOSIS — I10 Essential (primary) hypertension: Secondary | ICD-10-CM | POA: Diagnosis not present

## 2022-08-02 DIAGNOSIS — E782 Mixed hyperlipidemia: Secondary | ICD-10-CM

## 2022-08-02 DIAGNOSIS — R7303 Prediabetes: Secondary | ICD-10-CM

## 2022-08-02 DIAGNOSIS — R002 Palpitations: Secondary | ICD-10-CM

## 2022-08-02 NOTE — Patient Instructions (Addendum)
Medication Instructions:   Your physician recommends that you continue on your current medications as directed. Please refer to the Current Medication list given to you today.   *If you need a refill on your cardiac medications before your next appointment, please call your pharmacy*   Lab Work:   RETURN FASTING CMET  LIPIDS  A1C    If you have labs (blood work) drawn today and your tests are completely normal, you will receive your results only by: MyChart Message (if you have MyChart) OR A paper copy in the mail If you have any lab test that is abnormal or we need to change your treatment, we will call you to review the results.   Testing/Procedures: NONE ORDERED  TODAY    Follow-Up: At [facility], you and your health needs are our priority.  As part of our continuing mission to provide you with exceptional heart care, we have created designated Provider Care Teams.  These Care Teams include your primary Cardiologist (physician) and Advanced Practice Providers (APPs -  Physician Assistants and Nurse Practitioners) who all work together to provide you with the care you need, when you need it.  We recommend signing up for the patient portal called "MyChart".  Sign up information is provided on this After Visit Summary.  MyChart is used to connect with patients for Virtual Visits (Telemedicine).  Patients are able to view lab/test results, encounter notes, upcoming appointments, etc.  Non-urgent messages can be sent to your provider as well.   To learn more about what you can do with MyChart, go to [email or web address].    Your next appointment:   1 year(s)   Provider:   Lance Muss, MD     Other Instructions

## 2022-08-02 NOTE — Progress Notes (Signed)
Office Visit    Patient Name: Reginald Simmons Date of Encounter: 08/02/2022  PCP:  Nelwyn Salisbury, MD   [facility]  Cardiologist:  Lance Muss, MD  Advanced Practice Provider:  No care team member to display Electrophysiologist:  None   HPI    FAYE DOAR is a 52 y.o. male with a past medical history of COVID July 2020, GERD, hypertension, hyperlipidemia presents today for follow-up appointment.  When he had COVID he had diarrhea, chills, fever, body aches, congestion, and cough.  No shortness of breath.  Was able to stay home.  Has palpitations about a month prior to previous visit (April 2023).  Had some lightheadedness and called EMS.  In the past his mother had coronary artery disease in her 14s and father had a stroke around age 53.  He has 4 siblings and none of them have heart disease.  No history of smoking.  No diabetes history.  He does a lot of walking at work.  He is a Psychologist, occupational.  After COVID, he had trouble dealing with smoke.  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  Of late he was working out 4 days a week at work.  Tries eat healthy.  Had some asymptomatic chest pain with certain chest movements.  This have resolved.  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  He denied chest pain, dizziness, leg edema, nitroglycerin use, orthopnea, palpitations, paroxysmal nocturnal dyspnea, shortness of breath, syncope.  Today, he tells me that he has no chest pain or shortness of breath.  He has been eating a lot better and exercising 3 days a week.  He is able to use the gym at his work at lunchtime and usually gets a lot of steps at his job between 10-30,000.  He drinks V8 juice with lemon, ginger, and cayenne pepper.  Works at [employer].  He is doing well but does not anymore because it was affecting his breathing.  He has been breathing much better.  He typically stops by  [employer] on his way home.  Eats a lot of raw vegetables.  Weight is going down.   Was 300 pounds last year and now 280 (on home scale).  Palpitations have improved.  Reports no shortness of breath nor dyspnea on exertion. Reports no chest pain, pressure, or tightness. No edema, orthopnea, PND. Reports no palpitations.   Past Medical History    Past Medical History:  Diagnosis Date   Allergy    GERD (gastroesophageal reflux disease)    Hyperlipidemia    Hypertension    Onychomycosis    History reviewed. No pertinent surgical history.  Allergies  No Known Allergies  EKGs/Labs/Other Studies Reviewed:   The following studies were reviewed today: Cardiac Studies & Procedures     STRESS TESTS  EXERCISE TOLERANCE TEST (ETT) 12/01/2017  Narrative  Blood pressure demonstrated a hypertensive response to exercise.  There was no ST segment deviation noted during stress.  No T wave inversion was noted during stress.  Normal ETT Hypertensive response to exercise               EKG:  EKG is NSR in 75 bpm ordered today.  The ekg ordered today demonstrates RBBB (old)  Recent Labs: 12/14/2021: ALT 22; BUN 19; Creatinine, Ser 1.01; Potassium 3.6; Sodium 140; TSH 0.93 12/31/2021: Hemoglobin 14.4; Platelet Count 131  Recent Lipid Panel    Component Value Date/Time   CHOL 175 12/14/2021 1145  CHOL 160 10/13/2020 1419   TRIG 68.0 12/14/2021 1145   TRIG 100 12/13/2005 1034   HDL 56.60 12/14/2021 1145   HDL 58 10/13/2020 1419   CHOLHDL 3 12/14/2021 1145   VLDL 13.6 12/14/2021 1145   LDLCALC 105 (H) 12/14/2021 1145   LDLCALC 85 10/13/2020 1419   LDLCALC 73 12/03/2019 1103   LDLDIRECT 201.4 09/18/2012 0957    Home Medications   Current Meds  Medication Sig   amLODipine (NORVASC) 10 MG tablet Take 1 tablet (10 mg total) by mouth daily.   cetirizine (ZYRTEC) 10 MG tablet Take 10 mg by mouth daily.   chlorthalidone (HYGROTON) 25 MG tablet Take 1 tablet (25 mg total) by mouth daily.   rosuvastatin (CRESTOR) 40 MG tablet Take 1 tablet (40 mg total) by mouth  daily. Please keep upcoming appt for future refills.     Review of Systems      All other systems reviewed and are otherwise negative except as noted above.  Physical Exam    VS:  BP 126/84   Pulse 75   Ht 6\' 1"  (1.854 m)   Wt 294 lb 9.6 oz (133.6 kg)   SpO2 98%   BMI 38.87 kg/m  , BMI Body mass index is 38.87 kg/m.  Wt Readings from Last 3 Encounters:  08/02/22 294 lb 9.6 oz (133.6 kg)  01/16/22 290 lb (131.5 kg)  01/11/22 290 lb (131.5 kg)     GEN: Well nourished, well developed, in no acute distress. HEENT: normal. Neck: Supple, no JVD, carotid bruits, or masses. Cardiac: RRR, no murmurs, rubs, or gallops. No clubbing, cyanosis, edema.  Radials/PT 2+ and equal bilaterally.  Respiratory:  Respirations regular and unlabored, clear to auscultation bilaterally. GI: Soft, nontender, nondistended. MS: No deformity or atrophy. Skin: Warm and dry, no rash. Neuro:  Strength and sensation are intact. Psych: Normal affect.  Assessment & Plan    Family history of CAD -Continue current diet which includes whole food, plant-based. -Continue current medications: Amlodipine 10 mg daily, chlorthalidone 25 mg daily, Crestor 40 mg daily -continue daily exercise  Hyperlipidemia -Will order an updated lipid panel and LFTs -Continue Crestor 20 mg daily -Last LDL 105  Hypertension -Today, 126/84 -Continue current medication -Continue to monitor blood pressure at home -Continue low-sodium, heart healthy diet  Palpitations -sometimes a little flutter here and there  -much better than it used to be, lasts a few seconds  Morbid obesity -weight is down to between 280-290 -continue diet and exercise modifications  Prediabetic, A1c 6.1 -he has been cleaning up his diet -A1c, lipid panel, CMP ordered today for him to complete when he is fasted     Disposition: Follow up 1 year with Lance Muss, MD or APP.  Signed, Sharlene Dory, PA-C 08/02/2022, 4:25 PM [facility]

## 2022-08-06 NOTE — Addendum Note (Signed)
Addended by: Anselm Lis A on: 08/06/2022 03:49 PM   Modules accepted: Orders

## 2022-08-09 ENCOUNTER — Ambulatory Visit: Payer: 59 | Attending: Physician Assistant

## 2022-08-09 DIAGNOSIS — Z8249 Family history of ischemic heart disease and other diseases of the circulatory system: Secondary | ICD-10-CM

## 2022-08-10 ENCOUNTER — Telehealth: Payer: Self-pay | Admitting: Cardiology

## 2022-08-10 DIAGNOSIS — E876 Hypokalemia: Secondary | ICD-10-CM

## 2022-08-10 LAB — COMPREHENSIVE METABOLIC PANEL
ALT: 21 IU/L (ref 0–44)
AST: 27 IU/L (ref 0–40)
Albumin/Globulin Ratio: 1.5 (ref 1.2–2.2)
Albumin: 4.1 g/dL (ref 4.1–5.1)
Alkaline Phosphatase: 48 IU/L (ref 44–121)
BUN/Creatinine Ratio: 17 (ref 9–20)
BUN: 15 mg/dL (ref 6–24)
Bilirubin Total: 0.5 mg/dL (ref 0.0–1.2)
CO2: 26 mmol/L (ref 20–29)
Calcium: 8.6 mg/dL — ABNORMAL LOW (ref 8.7–10.2)
Chloride: 100 mmol/L (ref 96–106)
Creatinine, Ser: 0.88 mg/dL (ref 0.76–1.27)
Globulin, Total: 2.8 g/dL (ref 1.5–4.5)
Glucose: 94 mg/dL (ref 70–99)
Potassium: 2.8 mmol/L — CL (ref 3.5–5.2)
Sodium: 139 mmol/L (ref 134–144)
Total Protein: 6.9 g/dL (ref 6.0–8.5)
eGFR: 105 mL/min/{1.73_m2} (ref >59)

## 2022-08-10 LAB — HEMOGLOBIN A1C
Est. average glucose Bld gHb Est-mCnc: 128 mg/dL
Hgb A1c MFr Bld: 6.1 % — ABNORMAL HIGH (ref 4.8–5.6)

## 2022-08-10 LAB — LIPID PANEL
Chol/HDL Ratio: 2.9 ratio (ref 0.0–5.0)
Cholesterol, Total: 157 mg/dL (ref 100–199)
HDL: 55 mg/dL (ref >39)
LDL Chol Calc (NIH): 90 mg/dL (ref 0–99)
Triglycerides: 60 mg/dL (ref 0–149)
VLDL Cholesterol Cal: 12 mg/dL (ref 5–40)

## 2022-08-10 MED ORDER — POTASSIUM CHLORIDE CRYS ER 20 MEQ PO TBCR: 20.0000 meq | EXTENDED_RELEASE_TABLET | Freq: Two times a day (BID) | ORAL | 0 refills | Status: DC

## 2022-08-10 NOTE — Telephone Encounter (Signed)
On-call pager line: Chief complaint: Critical lab value potassium 2.8   Notified by lab that patient had a potassium of 2.8.  Patient without any significant complaints, denies any acute symptoms of chest pain, shortness of breath, palpitations, syncopal episodes.  Will stop chlorthalidone and give potassium 20 meq BID for the next 3 days.  Patient has instructions to get BMET done on June 11 at [facility] office.  I will forward this correspondence to Jari Favre and make her aware of the situation to follow-up with the patient's plan for blood pressure medications and critical lab value.  These instructions have been repeated back to me and agreed upon.

## 2022-08-14 ENCOUNTER — Ambulatory Visit: Payer: 59 | Attending: Cardiology

## 2022-08-14 ENCOUNTER — Other Ambulatory Visit: Payer: Self-pay | Admitting: *Deleted

## 2022-08-14 DIAGNOSIS — E876 Hypokalemia: Secondary | ICD-10-CM

## 2022-08-15 LAB — BASIC METABOLIC PANEL
BUN/Creatinine Ratio: 12 (ref 9–20)
BUN: 11 mg/dL (ref 6–24)
CO2: 23 mmol/L (ref 20–29)
Calcium: 8.8 mg/dL (ref 8.7–10.2)
Chloride: 103 mmol/L (ref 96–106)
Creatinine, Ser: 0.94 mg/dL (ref 0.76–1.27)
Glucose: 111 mg/dL — ABNORMAL HIGH (ref 70–99)
Potassium: 4.1 mmol/L (ref 3.5–5.2)
Sodium: 140 mmol/L (ref 134–144)
eGFR: 99 mL/min/{1.73_m2} (ref >59)

## 2022-08-23 ENCOUNTER — Emergency Department (HOSPITAL_COMMUNITY)
Admission: EM | Admit: 2022-08-23 | Discharge: 2022-08-24 | Disposition: A | Discharge status: Home / Self Care | Payer: Worker's Compensation | Attending: Emergency Medicine | Admitting: Emergency Medicine

## 2022-08-23 ENCOUNTER — Emergency Department (HOSPITAL_COMMUNITY): Payer: Worker's Compensation

## 2022-08-23 DIAGNOSIS — M25562 Pain in left knee: Secondary | ICD-10-CM | POA: Diagnosis present

## 2022-08-23 LAB — CBC
HCT: 42.4 % (ref 39.0–52.0)
Hemoglobin: 13.8 g/dL (ref 13.0–17.0)
MCH: 29.1 pg (ref 26.0–34.0)
MCHC: 32.5 g/dL (ref 30.0–36.0)
MCV: 89.3 fL (ref 80.0–100.0)
Platelets: 132 10*3/uL — ABNORMAL LOW (ref 150–400)
RBC: 4.75 MIL/uL (ref 4.22–5.81)
RDW: 15.1 % (ref 11.5–15.5)
WBC: 4.6 10*3/uL (ref 4.0–10.5)
nRBC: 0 % (ref 0.0–0.2)

## 2022-08-23 LAB — BASIC METABOLIC PANEL WITH GFR
Anion gap: 9 (ref 5–15)
BUN: 14 mg/dL (ref 6–20)
CO2: 25 mmol/L (ref 22–32)
Calcium: 8.6 mg/dL — ABNORMAL LOW (ref 8.9–10.3)
Chloride: 103 mmol/L (ref 98–111)
Creatinine, Ser: 0.93 mg/dL (ref 0.61–1.24)
GFR, Estimated: >60 mL/min
Glucose, Bld: 105 mg/dL — ABNORMAL HIGH (ref 70–99)
Potassium: 3.4 mmol/L — ABNORMAL LOW (ref 3.5–5.1)
Sodium: 137 mmol/L (ref 135–145)

## 2022-08-23 LAB — TROPONIN I (HIGH SENSITIVITY): Troponin I (High Sensitivity): 4 ng/L

## 2022-08-23 NOTE — ED Triage Notes (Signed)
Patient here from home reporting left center chest pain that comes and goes "feels like a sting" started today with no nausea/vomiting. Also reporting swelling and pain to left knee.

## 2022-08-23 NOTE — ED Provider Notes (Signed)
[facility] EMERGENCY DEPARTMENT AT [facility] Provider Note   CSN: [id number] Arrival date & time: 08/23/22  2104     History  Chief Complaint  Patient presents with   Chest Pain   Knee Pain    Reginald Simmons is a 52 y.o. male.  52 yo M with a chief complaints of left knee pain and chest pain.  The knee pains been going on for couple weeks.  He said he had a very arduous work week and he felt like the pain started shortly thereafter.  Seems to get better overnight and then is painful throughout the day and is more swollen at the end of the day.  He denies overt trauma.  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  Denies other injury.  He has a secondary complaint of having chest discomfort.  Started about 12 hours ago.  Lasts for about a seconds and then resolves.  Just right of center.  Nothing seems to make it better or worse.  Patient denies history of MI, denies  diabetes or smoking.  Patient has a history of hypertension and hyperlipidemia.  Mom had an MI in her 28s   Patient denies history of PE or DVT denies hemoptysis denies unilateral lower extremity edema denies recent surgery immobilization hospitalization estrogen use or history of cancer.     Chest Pain Knee Pain      Home Medications Prior to Admission medications   Medication Sig Start Date End Date Taking? Authorizing Provider  amLODipine (NORVASC) 10 MG tablet Take 1 tablet (10 mg total) by mouth daily. 12/14/21   Nelwyn Salisbury, MD  cetirizine (ZYRTEC) 10 MG tablet Take 10 mg by mouth daily.    [provider]  chlorthalidone (HYGROTON) 25 MG tablet Take 1 tablet (25 mg total) by mouth daily. 12/14/21   Nelwyn Salisbury, MD  potassium chloride SA (KLOR-CON M) 20 MEQ tablet Take 1 tablet (20 mEq total) by mouth 2 (two) times daily. 08/10/22   Abagail Kitchens, PA-C  rosuvastatin (CRESTOR) 40 MG tablet Take 1 tablet (40 mg total) by mouth daily.  Please keep upcoming appt for future refills. 12/14/21   Nelwyn Salisbury, MD      Allergies    Patient has no known allergies.    Review of Systems   Review of Systems  Cardiovascular:  Positive for chest pain.    Physical Exam Updated Vital Signs BP (!) 137/90 (BP Location: Left Arm)   Pulse 71   Temp 98.3 F (36.8 C)   Resp 17   SpO2 99%  Physical Exam Vitals and nursing note reviewed.  Constitutional:      Appearance: He is well-developed.  HENT:     Head: Normocephalic and atraumatic.  Eyes:     Pupils: Pupils are equal, round, and reactive to light.  Neck:     Vascular: No JVD.  Cardiovascular:     Rate and Rhythm: Normal rate and regular rhythm.     Heart sounds: No murmur heard.    No friction rub. No gallop.  Pulmonary:     Effort: No respiratory distress.     Breath sounds: No wheezing.  Chest:     Chest wall: No tenderness.  Abdominal:     General: There is no distension.     Tenderness: There is no abdominal tenderness. There is no guarding or rebound.  Musculoskeletal:  General: Normal range of motion.     Cervical back: Normal range of motion and neck supple.     Comments: Pain and mild swelling to the left knee.  He has some ligamentous laxity along the LCL.  He has a positive McMurray's test with the medial meniscus.  His right knee is significantly lax along the MCL.  He has no ongoing pain of the right knee.  Pulse motor and sensation intact in the left lower extremity.  Skin:    Coloration: Skin is not pale.     Findings: No rash.  Neurological:     Mental Status: He is alert and oriented to person, place, and time.  Psychiatric:        Behavior: Behavior normal.     ED Results / Procedures / Treatments   Labs (all labs ordered are listed, but only abnormal results are displayed) Labs Reviewed  BASIC METABOLIC PANEL - Abnormal; Notable for the following components:      Result Value   Potassium 3.4 (*)    Glucose, Bld 105 (*)     Calcium 8.6 (*)    All other components within normal limits  CBC - Abnormal; Notable for the following components:   Platelets 132 (*)    All other components within normal limits  TROPONIN I (HIGH SENSITIVITY)  TROPONIN I (HIGH SENSITIVITY)    EKG EKG Interpretation  Date/Time:  Friday August 23 2022 21:32:56 EDT Ventricular Rate:  70 PR Interval:  204 QRS Duration: 142 QT Interval:  437 QTC Calculation: 472 R Axis:   0 Text Interpretation: Sinus rhythm Borderline prolonged PR interval Right bundle branch block new rbbb since 2021 Otherwise no significant change Confirmed by Melene Plan [phone]) on 08/23/2022 11:30:50 PM  Radiology DG Knee Complete 4 Views Left  Result Date: 08/23/2022 CLINICAL DATA:  Knee pain EXAM: LEFT KNEE - COMPLETE 4+ VIEW COMPARISON:  None Available. FINDINGS: No fracture or malalignment. Mild patellofemoral degenerative change. Positive for knee effusion IMPRESSION: Mild degenerative change. Knee effusion. Electronically Signed   By: Jasmine Pang M.D.   On: 08/23/2022 21:55   DG Chest 2 View  Result Date: 08/23/2022 CLINICAL DATA:  Chest pain EXAM: CHEST - 2 VIEW COMPARISON:  02/09/2020 FINDINGS: The heart size and mediastinal contours are within normal limits. Both lungs are clear. The visualized skeletal structures are unremarkable. IMPRESSION: No active cardiopulmonary disease. Electronically Signed   By: Jasmine Pang M.D.   On: 08/23/2022 21:55    Procedures Procedures    Medications Ordered in ED Medications - No data to display  ED Course/ Medical Decision Making/ A&P                             Medical Decision Making Amount and/or Complexity of Data Reviewed Labs: ordered. Radiology: ordered.   52 yo M with a chief complaint of chest pain.  Completely atypical of ACS or PE.  Going on for greater than 12 hours.  He has a negative troponin here.  Do not feel he needs a delta.  EKG with new right bundle branch like pattern but no signs of  ischemia.  Chest x-ray independently interpreted by me without focal infiltrate or pneumothorax.  No anemia no significant electrolyte abnormalities.  Patient also has complaints of knee pain.  Going on for a couple weeks.  He has significant ligamentous laxity along the LCL.  Will place in a knee sleeve crutches given  follow-up with orthopedics.  11:58 PM:  I have discussed the diagnosis/risks/treatment options with the patient.  Evaluation and diagnostic testing in the emergency department does not suggest an emergent condition requiring admission or immediate intervention beyond what has been performed at this time.  They will follow up with Ortho. We also discussed returning to the ED immediately if new or worsening sx occur. We discussed the sx which are most concerning (e.g., sudden worsening pain, fever, inability to tolerate by mouth) that necessitate immediate return. Medications administered to the patient during their visit and any new prescriptions provided to the patient are listed below.  Medications given during this visit Medications - No data to display   The patient appears reasonably screen and/or stabilized for discharge and I doubt any other medical condition or other [facility] requiring further screening, evaluation, or treatment in the ED at this time prior to discharge.          Final Clinical Impression(s) / ED Diagnoses Final diagnoses:  Acute pain of left knee    Rx / DC Orders ED Discharge Orders     None         Melene Plan, DO 08/23/22 2358

## 2022-08-23 NOTE — Discharge Instructions (Signed)
Take 4 over the counter ibuprofen tablets 3 times a day or 2 over-the-counter naproxen tablets twice a day for pain. Also take tylenol 1000mg(2 extra strength) four times a day.    Try pepcid or tagamet up to twice a day.  Try to avoid things that may make this worse, most commonly these are spicy foods tomato based products fatty foods chocolate and peppermint.  Alcohol and tobacco can also make this worse.  Return to the emergency department for sudden worsening pain fever or inability to eat or drink.   

## 2022-09-13 ENCOUNTER — Other Ambulatory Visit: Payer: Self-pay | Admitting: Family Medicine

## 2022-12-09 ENCOUNTER — Other Ambulatory Visit: Payer: Self-pay | Admitting: Family Medicine

## 2022-12-09 DIAGNOSIS — I10 Essential (primary) hypertension: Secondary | ICD-10-CM

## 2022-12-20 ENCOUNTER — Encounter: Payer: 59 | Admitting: Family Medicine

## 2023-01-03 ENCOUNTER — Ambulatory Visit (INDEPENDENT_AMBULATORY_CARE_PROVIDER_SITE_OTHER): Payer: 59 | Admitting: Family Medicine

## 2023-01-03 ENCOUNTER — Encounter: Payer: 59 | Admitting: Family Medicine

## 2023-01-03 VITALS — BP 110/70 | HR 53 | Temp 98.6°F | Ht 72.5 in | Wt 293.0 lb

## 2023-01-03 DIAGNOSIS — Z0000 Encounter for general adult medical examination without abnormal findings: Secondary | ICD-10-CM

## 2023-01-03 DIAGNOSIS — Z209 Contact with and (suspected) exposure to unspecified communicable disease: Secondary | ICD-10-CM

## 2023-01-03 DIAGNOSIS — L739 Follicular disorder, unspecified: Secondary | ICD-10-CM | POA: Insufficient documentation

## 2023-01-03 LAB — LIPID PANEL
Cholesterol: 162 mg/dL (ref 0–200)
HDL: 61.5 mg/dL (ref >39.00)
LDL Cholesterol: 86 mg/dL (ref 0–99)
NonHDL: 100.66
Total CHOL/HDL Ratio: 3
Triglycerides: 75 mg/dL (ref 0.0–149.0)
VLDL: 15 mg/dL (ref 0.0–40.0)

## 2023-01-03 LAB — BASIC METABOLIC PANEL
BUN: 17 mg/dL (ref 6–23)
CO2: 31 meq/L (ref 19–32)
Calcium: 9.2 mg/dL (ref 8.4–10.5)
Chloride: 100 meq/L (ref 96–112)
Creatinine, Ser: 0.93 mg/dL (ref 0.40–1.50)
GFR: 95.53 mL/min (ref >60.00)
Glucose, Bld: 88 mg/dL (ref 70–99)
Potassium: 3.6 meq/L (ref 3.5–5.1)
Sodium: 140 meq/L (ref 135–145)

## 2023-01-03 LAB — HEPATIC FUNCTION PANEL
ALT: 29 U/L (ref 0–53)
AST: 24 U/L (ref 0–37)
Albumin: 4.4 g/dL (ref 3.5–5.2)
Alkaline Phosphatase: 50 U/L (ref 39–117)
Bilirubin, Direct: 0.2 mg/dL (ref 0.0–0.3)
Total Bilirubin: 0.7 mg/dL (ref 0.2–1.2)
Total Protein: 8 g/dL (ref 6.0–8.3)

## 2023-01-03 LAB — CBC WITH DIFFERENTIAL/PLATELET
Basophils Absolute: 0 10*3/uL (ref 0.0–0.1)
Basophils Relative: 0.3 % (ref 0.0–3.0)
Eosinophils Absolute: 0 10*3/uL (ref 0.0–0.7)
Eosinophils Relative: 0.5 % (ref 0.0–5.0)
HCT: 45.6 % (ref 39.0–52.0)
Hemoglobin: 14.7 g/dL (ref 13.0–17.0)
Lymphocytes Relative: 23.6 % (ref 12.0–46.0)
Lymphs Abs: 1.4 10*3/uL (ref 0.7–4.0)
MCHC: 32.3 g/dL (ref 30.0–36.0)
MCV: 91.3 fL (ref 78.0–100.0)
Monocytes Absolute: 0.5 10*3/uL (ref 0.1–1.0)
Monocytes Relative: 8.5 % (ref 3.0–12.0)
Neutro Abs: 4 10*3/uL (ref 1.4–7.7)
Neutrophils Relative %: 67.1 % (ref 43.0–77.0)
Platelets: 143 10*3/uL — ABNORMAL LOW (ref 150.0–400.0)
RBC: 4.99 Mil/uL (ref 4.22–5.81)
RDW: 15.1 % (ref 11.5–15.5)
WBC: 6 10*3/uL (ref 4.0–10.5)

## 2023-01-03 LAB — HEMOGLOBIN A1C: Hgb A1c MFr Bld: 6.2 % (ref 4.6–6.5)

## 2023-01-03 LAB — PSA: PSA: 3.83 ng/mL (ref 0.10–4.00)

## 2023-01-03 LAB — TSH: TSH: 0.91 u[IU]/mL (ref 0.35–5.50)

## 2023-01-03 MED ORDER — POTASSIUM CHLORIDE CRYS ER 20 MEQ PO TBCR: 20.0000 meq | EXTENDED_RELEASE_TABLET | Freq: Every day | ORAL | 3 refills | Status: DC

## 2023-01-03 MED ORDER — DOXYCYCLINE HYCLATE 100 MG PO TABS
100.0000 mg | ORAL_TABLET | Freq: Two times a day (BID) | ORAL | 3 refills | Status: DC
Start: 2023-01-03 — End: 2024-01-09

## 2023-01-03 NOTE — Progress Notes (Signed)
Subjective:    Patient ID: Reginald Simmons, male    DOB: 1970-07-17, 52 y.o.   MRN: [id number]  HPI Here for a well exam. He has a few issues to discuss. First he asks to be checked for STD's. He has no symptoms, but he and his wife have separated and he wants testing for his piece of mind. Also he has had recurrent bouts of tender small boils on his skin for the past year. These occur in the armpits and in the perineum.    Review of Systems  Constitutional: Negative.   HENT: Negative.    Eyes: Negative.   Respiratory: Negative.    Cardiovascular: Negative.   Gastrointestinal: Negative.   Genitourinary: Negative.   Musculoskeletal: Negative.   Skin:  Positive for rash.  Neurological: Negative.   Psychiatric/Behavioral: Negative.         Objective:   Physical Exam Constitutional:      General: He is not in acute distress.    Appearance: Normal appearance. He is well-developed. He is not diaphoretic.  HENT:     Head: Normocephalic and atraumatic.     Right Ear: External ear normal.     Left Ear: External ear normal.     Nose: Nose normal.     Mouth/Throat:     Pharynx: No oropharyngeal exudate.  Eyes:     General: No scleral icterus.       Right eye: No discharge.        Left eye: No discharge.     Conjunctiva/sclera: Conjunctivae normal.     Pupils: Pupils are equal, round, and reactive to light.  Neck:     Thyroid: No thyromegaly.     Vascular: No JVD.     Trachea: No tracheal deviation.  Cardiovascular:     Rate and Rhythm: Normal rate and regular rhythm.     Pulses: Normal pulses.     Heart sounds: Normal heart sounds. No murmur heard.    No friction rub. No gallop.  Pulmonary:     Effort: Pulmonary effort is normal. No respiratory distress.     Breath sounds: Normal breath sounds. No wheezing or rales.  Chest:     Chest wall: No tenderness.  Abdominal:     General: Bowel sounds are normal. There is no distension.     Palpations: Abdomen is soft. There is  no mass.     Tenderness: There is no abdominal tenderness. There is no guarding or rebound.  Genitourinary:    Penis: Normal. No tenderness.      Testes: Normal.     Prostate: Normal.     Rectum: Normal. Guaiac result negative.  Musculoskeletal:        General: No tenderness. Normal range of motion.     Cervical back: Neck supple.  Lymphadenopathy:     Cervical: No cervical adenopathy.  Skin:    General: Skin is warm and dry.     Coloration: Skin is not pale.     Findings: No erythema, lesion or rash.  Neurological:     General: No focal deficit present.     Mental Status: He is alert and oriented to person, place, and time.     Cranial Nerves: No cranial nerve deficit.     Motor: No abnormal muscle tone.     Coordination: Coordination normal.     Deep Tendon Reflexes: Reflexes are normal and symmetric. Reflexes normal.  Psychiatric:        Mood and  Affect: Mood normal.        Behavior: Behavior normal.        Thought Content: Thought content normal.        Judgment: Judgment normal.           Assessment & Plan:  Well exam. We discussed diet and exercise. Get fasting labs. He has recurrent folliculitis, so we will start him on daily Doxycycline for maintenance. Screen for STD's as above.  Gershon Crane, MD

## 2023-01-04 LAB — RPR: RPR Ser Ql: NONREACTIVE

## 2023-01-04 LAB — HSV(HERPES SIMPLEX VRS) I + II AB-IGG
HSV 1 IGG,TYPE SPECIFIC AB: <0.9 {index}
HSV 2 IGG,TYPE SPECIFIC AB: <0.9 {index}

## 2023-01-04 LAB — HIV ANTIBODY (ROUTINE TESTING W REFLEX): HIV 1&2 Ab, 4th Generation: NONREACTIVE

## 2023-01-04 LAB — C. TRACHOMATIS/N. GONORRHOEAE RNA
C. trachomatis RNA, TMA: NOT DETECTED
N. gonorrhoeae RNA, TMA: NOT DETECTED

## 2023-01-10 ENCOUNTER — Other Ambulatory Visit: Payer: Self-pay | Admitting: *Deleted

## 2023-01-10 DIAGNOSIS — D696 Thrombocytopenia, unspecified: Secondary | ICD-10-CM

## 2023-01-14 ENCOUNTER — Telehealth: Payer: Self-pay | Admitting: *Deleted

## 2023-01-14 NOTE — Telephone Encounter (Signed)
Reginald Simmons called to cancel lab/OV on 11/15. PCP did CBC/diff on 11/01 and platelets were improved at 143. He is asking if he still needs to f/u with hematology since his PCP is checking CBC every year?

## 2023-01-15 NOTE — Telephone Encounter (Signed)
Per Dr. Truett Perna, as long as PCP checks CBC annually and prn any bleeding, he does not need to f/u here unless he develops low platelet count. Left this information on his voice mail.

## 2023-01-17 ENCOUNTER — Inpatient Hospital Stay: Payer: 59

## 2023-01-17 ENCOUNTER — Other Ambulatory Visit: Payer: 59

## 2023-01-17 ENCOUNTER — Inpatient Hospital Stay: Payer: 59 | Admitting: Oncology

## 2023-01-17 ENCOUNTER — Ambulatory Visit: Payer: 59 | Admitting: Nurse Practitioner

## 2023-03-04 ENCOUNTER — Encounter: Payer: Self-pay | Admitting: Physician Assistant

## 2023-03-04 DIAGNOSIS — Z79899 Other long term (current) drug therapy: Secondary | ICD-10-CM

## 2023-03-06 NOTE — Telephone Encounter (Signed)
 Pt is calling checking status of the message

## 2023-03-14 MED ORDER — LOSARTAN POTASSIUM 25 MG PO TABS: 25.0000 mg | ORAL_TABLET | Freq: Every day | ORAL | 3 refills | Status: DC

## 2023-03-14 MED ORDER — AMLODIPINE BESYLATE 5 MG PO TABS: 5.0000 mg | ORAL_TABLET | Freq: Every day | ORAL | 3 refills | Status: DC

## 2023-04-05 LAB — BASIC METABOLIC PANEL
BUN/Creatinine Ratio: 14 (ref 9–20)
BUN: 13 mg/dL (ref 6–24)
CO2: 24 mmol/L (ref 20–29)
Calcium: 9.4 mg/dL (ref 8.7–10.2)
Chloride: 100 mmol/L (ref 96–106)
Creatinine, Ser: 0.91 mg/dL (ref 0.76–1.27)
Glucose: 98 mg/dL (ref 70–99)
Potassium: 3.7 mmol/L (ref 3.5–5.2)
Sodium: 140 mmol/L (ref 134–144)
eGFR: 102 mL/min/{1.73_m2} (ref >59)

## 2023-04-24 ENCOUNTER — Encounter: Payer: Self-pay | Admitting: Physician Assistant

## 2023-09-17 ENCOUNTER — Other Ambulatory Visit: Payer: Self-pay | Admitting: Family Medicine

## 2023-09-24 ENCOUNTER — Ambulatory Visit: Admitting: Physician Assistant

## 2023-11-28 ENCOUNTER — Ambulatory Visit: Attending: Physician Assistant | Admitting: Physician Assistant

## 2023-11-28 VITALS — BP 120/76 | HR 50 | Ht 74.0 in | Wt 282.0 lb

## 2023-11-28 DIAGNOSIS — E782 Mixed hyperlipidemia: Secondary | ICD-10-CM

## 2023-11-28 DIAGNOSIS — I10 Essential (primary) hypertension: Secondary | ICD-10-CM

## 2023-11-28 DIAGNOSIS — Z79899 Other long term (current) drug therapy: Secondary | ICD-10-CM | POA: Diagnosis not present

## 2023-11-28 DIAGNOSIS — R7303 Prediabetes: Secondary | ICD-10-CM

## 2023-11-28 DIAGNOSIS — R002 Palpitations: Secondary | ICD-10-CM

## 2023-11-28 DIAGNOSIS — Z8249 Family history of ischemic heart disease and other diseases of the circulatory system: Secondary | ICD-10-CM

## 2023-11-28 LAB — LIPID PANEL

## 2023-11-28 MED ORDER — CHLORTHALIDONE 25 MG PO TABS: 12.5000 mg | ORAL_TABLET | Freq: Every day | ORAL | 3 refills | Status: AC

## 2023-11-28 MED ORDER — POTASSIUM CHLORIDE CRYS ER 20 MEQ PO TBCR: 20.0000 meq | EXTENDED_RELEASE_TABLET | Freq: Every day | ORAL | 3 refills | Status: DC

## 2023-11-28 MED ORDER — LOSARTAN POTASSIUM 25 MG PO TABS: 25.0000 mg | ORAL_TABLET | Freq: Every day | ORAL | 3 refills | Status: AC

## 2023-11-28 MED ORDER — AMLODIPINE BESYLATE 5 MG PO TABS: 5.0000 mg | ORAL_TABLET | Freq: Every day | ORAL | 3 refills | Status: AC

## 2023-11-28 MED ORDER — ROSUVASTATIN CALCIUM 40 MG PO TABS: 40.0000 mg | ORAL_TABLET | Freq: Every day | ORAL | 3 refills | Status: AC

## 2023-11-28 NOTE — Patient Instructions (Addendum)
 Thank you for choosing [facility]!     Medication Instructions:  Your refills have been sent to your pharmacy. *If you need a refill on your cardiac medications before your next appointment, please call your pharmacy*   Lab Work: Labs will be drawn today.........................SABRA LIPID , A1C  Return in 1-2 weeks for more labs.........................BMET If you have labs (blood work) drawn today and your tests are completely normal, you will receive your results only by: MyChart Message (if you have MyChart) OR A paper copy in the mail If you have any lab test that is abnormal or we need to change your treatment, we will call you to review the results.   Testing/Procedures: No procedures were ordered during today's visit.   Your next appointment:  A letter will be mailed to you as a reminder to call the office for your next follow up appointment.  1 year(s)   Provider:   Orren Fabry, PA-C           Follow-Up: At [facility], you and your health needs are our priority.  As part of our continuing mission to provide you with exceptional heart care, we have created designated Provider Care Teams.  These Care Teams include your primary Cardiologist (physician) and Advanced Practice Providers (APPs -  Physician Assistants and Nurse Practitioners) who all work together to provide you with the care you need, when you need it. We recommend signing up for the patient portal called MyChart.  Sign up information is provided on this After Visit Summary.  MyChart is used to connect with patients for Virtual Visits (Telemedicine).  Patients are able to view lab/test results, encounter notes, upcoming appointments, etc.  Non-urgent messages can be sent to your provider as well.   To learn more about what you can do with MyChart, go to [email or web address].

## 2023-11-28 NOTE — Progress Notes (Signed)
 Office Visit    Patient Name: Reginald Simmons Date of Encounter: 11/28/2023  PCP:  Johnny Garnette LABOR, MD   [facility]  Cardiologist:  Candyce Reek, MD  Advanced Practice Provider:  No care team member to display Electrophysiologist:  None  APP: Orren Fabry, PA-C  HPI    Reginald Simmons is a 53 y.o. male with a past medical history of COVID July 2020, GERD, hypertension, hyperlipidemia presents today for follow-up appointment.  When he had COVID he had diarrhea, chills, fever, body aches, congestion, and cough.  No shortness of breath.  Was able to stay home.  Has palpitations about a month prior to previous visit (April 2023).  Had some lightheadedness and called EMS.  In the past his mother had coronary artery disease in her 45s and father had a stroke around age 39.  He has 4 siblings and none of them have heart disease.  No history of smoking.  No diabetes history.  He does a lot of walking at work.  He is a Psychologist, occupational.  After COVID, he had trouble dealing with smoke.  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  Of late he was working out 4 days a week at work.  Tries eat healthy.  Had some asymptomatic chest pain with certain chest movements.  This have resolved.  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  He denied chest pain, dizziness, leg edema, nitroglycerin use, orthopnea, palpitations, paroxysmal nocturnal dyspnea, shortness of breath, syncope.  I saw him 07/2022, he tells me that he has no chest pain or shortness of breath.  He has been eating a lot better and exercising 3 days a week.  He is able to use the gym at his work at lunchtime and usually gets a lot of steps at his job between 10-30,000.  He drinks V8 juice with lemon, ginger, and cayenne pepper.  Works at [employer].  He is doing well but does not anymore because it was affecting his breathing.  He has been breathing much better.  He typically stops by  [employer] on his way home.  Eats a lot of raw  vegetables.  Weight is going down.  Was 300 pounds last year and now 280 (on home scale).  Palpitations have improved.  Reports no shortness of breath nor dyspnea on exertion. Reports no chest pain, pressure, or tightness. No edema, orthopnea, PND. Reports no palpitations.   Today, he presents with hx of hypertension  for cardiovascular evaluation and medication management.  He experiences occasional lightheadedness, particularly when standing up quickly, which he attributes to his medication. 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 The sensation lasts a few seconds without causing falls. He is on chlorthalidone , losartan , and potassium supplements for hypertension. Recent blood pressure readings are lower than usual, with a reading of 110/70 mmHg.  He follows a whole food, plant-based diet with some meat, focusing on more vegetables and avoiding starches. He exercises regularly, achieving over 10,000 steps daily, often reaching 15,000 to 20,000 steps due to his active job. He works out during his lunch break, doing 20 minutes on an assault bike and 20 minutes on an AMT or stair climber. He has experienced weight loss and is currently at 325 pounds, but is concerned about his weight plateauing.  No current issues with chest pain, shortness of breath, or significant swelling. He has not had significant palpitations recently, and any  occurrences are very seldom.  Discussed the use of AI scribe software for clinical note transcription with the patient, who gave verbal consent to proceed.   Past Medical History    Past Medical History:  Diagnosis Date   Allergy    GERD (gastroesophageal reflux disease)    Hyperlipidemia    Hypertension    Onychomycosis    Past Surgical History:  Procedure Laterality Date   COLONOSCOPY  01/16/2022   per Dr. Stacia, adenomatous polyp, repeat in 3 years    Allergies  No Known  Allergies  EKGs/Labs/Other Studies Reviewed:   The following studies were reviewed today: Cardiac Studies & Procedures   ______________________________________________________________________________________________   STRESS TESTS  EXERCISE TOLERANCE TEST (ETT) 12/01/2017  Interpretation Summary  Blood pressure demonstrated a hypertensive response to exercise.  There was no ST segment deviation noted during stress.  No T wave inversion was noted during stress.  Normal ETT Hypertensive response to exercise            ______________________________________________________________________________________________       EKG:  EKG is NSR in 75 bpm ordered today.  The ekg ordered today demonstrates RBBB (old)  Recent Labs: 01/03/2023: ALT 29; Hemoglobin 14.7; Platelets 143.0; TSH 0.91 04/04/2023: BUN 13; Creatinine, Ser 0.91; Potassium 3.7; Sodium 140  Recent Lipid Panel    Component Value Date/Time   CHOL 162 01/03/2023 1451   CHOL 157 08/09/2022 0718   TRIG 75.0 01/03/2023 1451   TRIG 100 12/13/2005 1034   HDL 61.50 01/03/2023 1451   HDL 55 08/09/2022 0718   CHOLHDL 3 01/03/2023 1451   VLDL 15.0 01/03/2023 1451   LDLCALC 86 01/03/2023 1451   LDLCALC 90 08/09/2022 0718   LDLCALC 73 12/03/2019 1103   LDLDIRECT 201.4 09/18/2012 0957    Home Medications   No outpatient medications have been marked as taking for the 11/28/23 encounter (Appointment) with Lucien Orren SAILOR, PA-C.     Review of Systems      All other systems reviewed and are otherwise negative except as noted above.  Physical Exam    VS:  There were no vitals taken for this visit. , BMI There is no height or weight on file to calculate BMI.  Wt Readings from Last 3 Encounters:  01/03/23 293 lb (132.9 kg)  08/02/22 294 lb 9.6 oz (133.6 kg)  01/16/22 290 lb (131.5 kg)     GEN: Well nourished, well developed, in no acute distress. HEENT: normal. Neck: Supple, no JVD, carotid bruits, or  masses. Cardiac: RRR, no murmurs, rubs, or gallops. No clubbing, cyanosis, edema.  Radials/PT 2+ and equal bilaterally.  Respiratory:  Respirations regular and unlabored, clear to auscultation bilaterally. GI: Soft, nontender, nondistended. MS: No deformity or atrophy. Skin: Warm and dry, no rash. Neuro:  Strength and sensation are intact. Psych: Normal affect.  Assessment & Plan     Essential hypertension Blood pressure controlled at 120/76 mmHg. Lightheadedness likely from chlorthalidone -induced orthostatic hypotension. - Reduce chlorthalidone  to 12.5 mg or halve 25 mg tablet. - Monitor home blood pressure, report if consistently low. - Order kidney function and electrolyte labs in 1-2 weeks post dose adjustment. - Advise on hydration and compression socks for orthostatic symptoms.  Mixed hyperlipidemia Continues plant-based diet with occasional lean meats. - Order lipid panel today.  Prediabetes A1c was 6.2 in November. Current A1c to be checked for status and treatment guidance. - Order hemoglobin A1c today.  Morbid obesity due to excess calories Weight 325 lbs. Weight loss plateaued  despite lifestyle changes. Interested in GLP-1 receptor agonists for weight management. A1c 6.2 supports potential medication use. - Consult clinical pharmacists on GLP-1 receptor agonists for weight management. - Order hemoglobin A1c to assess status and support medication use.  Family history of CAD -Continue current diet which includes whole food, plant-based. -Continue current medications: Amlodipine  5 mg daily, chlorthalidone  25 mg daily, Crestor  40 mg daily -continue daily exercise    Disposition: Follow up 1 year with Candyce Reek, MD or APP.  Signed, Orren LOISE Fabry, PA-C 11/28/2023, 6:01 AM [facility]

## 2023-11-29 LAB — LIPID PANEL
Chol/HDL Ratio: 2.9 ratio (ref 0.0–5.0)
Cholesterol, Total: 175 mg/dL (ref 100–199)
HDL: 61 mg/dL (ref >39)
LDL Chol Calc (NIH): 103 mg/dL — ABNORMAL HIGH (ref 0–99)
Triglycerides: 59 mg/dL (ref 0–149)
VLDL Cholesterol Cal: 11 mg/dL (ref 5–40)

## 2023-11-29 LAB — HEMOGLOBIN A1C
Est. average glucose Bld gHb Est-mCnc: 126 mg/dL
Hgb A1c MFr Bld: 6 % — ABNORMAL HIGH (ref 4.8–5.6)

## 2023-12-02 ENCOUNTER — Ambulatory Visit: Payer: Self-pay | Admitting: Physician Assistant

## 2023-12-02 DIAGNOSIS — E782 Mixed hyperlipidemia: Secondary | ICD-10-CM

## 2024-01-02 ENCOUNTER — Ambulatory Visit: Admitting: Pharmacist Clinician (PhC)/ Clinical Pharmacy Specialist

## 2024-01-02 NOTE — Progress Notes (Deleted)
 Office Visit    Patient Name: Reginald Simmons Date of Encounter: 01/02/2024  Primary Care Provider:  Johnny Garnette LABOR, MD Primary Cardiologist:  Candyce Reek, MD  Chief Complaint    Weight management  Significant Past Medical History   HTN   HLD   preDM           No Known Allergies  History of Present Illness    Reginald Simmons is a 53 y.o. male patient of Dr Orren Fabry, in the office today to discuss options for weight management.      Current weight management medications:   Previously tried meds:   Current meds that may affect weight:   Baseline weight/BMI:   Insurance payor: commercial: [employer]  Diet: whole food, plant based diet; does get occasional meat but focused more on vegetables and avoiding starches; lots of raw vegetables  Exercise: walks at work, often 15-20K steps per day; also exercises at lunch - 20 min on assault bike and 20 min on treadmill or stair climber; eating out is often [employer]  Family History:   Confirmed patient not ***pregnant and no personal or family history of medullary thyroid  carcinoma (MTC) or Multiple Endocrine Neoplasia syndrome type 2 (MEN 2).   Social History:   Tobacco:  Alcohol:  Caffeine:   Adherence Assessment  Do you ever forget to take your medication? [] Yes [] No  Do you ever skip doses due to side effects? [] Yes [] No  Do you have trouble affording your medicines? [] Yes [] No  Are you ever unable to pick up your medication due to transportation difficulties? [] Yes [] No  Do you ever stop taking your medications because you don't believe they are helping? [] Yes [] No  Do you check your weight daily? [] Yes [] No   Adherence strategy: ***  Barriers to obtaining medications: ***   Accessory Clinical Findings    Lab Results  Component Value Date   CREATININE 0.91 04/04/2023   BUN 13 04/04/2023   NA 140 04/04/2023   K 3.7 04/04/2023   CL 100 04/04/2023   CO2 24 04/04/2023   Lab Results   Component Value Date   ALT 29 01/03/2023   AST 24 01/03/2023   ALKPHOS 50 01/03/2023   BILITOT 0.7 01/03/2023   Lab Results  Component Value Date   HGBA1C 6.0 (H) 11/28/2023      Home Medications/Allergies    Current Outpatient Medications  Medication Sig Dispense Refill   amLODipine  (NORVASC ) 5 MG tablet Take 1 tablet (5 mg total) by mouth daily. 90 tablet 3   cetirizine (ZYRTEC) 10 MG tablet Take 10 mg by mouth daily.     chlorthalidone  (HYGROTON ) 25 MG tablet Take 0.5 tablets (12.5 mg total) by mouth daily. 90 tablet 3   doxycycline  (VIBRA -TABS) 100 MG tablet Take 1 tablet (100 mg total) by mouth 2 (two) times daily. 180 tablet 3   losartan  (COZAAR ) 25 MG tablet Take 1 tablet (25 mg total) by mouth daily. 90 tablet 3   potassium chloride  SA (KLOR-CON  M) 20 MEQ tablet Take 1 tablet (20 mEq total) by mouth daily. 90 tablet 3   rosuvastatin  (CRESTOR ) 40 MG tablet Take 1 tablet (40 mg total) by mouth daily. 90 tablet 3   No current facility-administered medications for this visit.     No Known Allergies  Assessment & Plan    No problem-specific Assessment & Plan notes found for this encounter.   Allean Mink PharmD CPP [facility]  [address] [place], [place] [address] [phone]

## 2024-01-09 ENCOUNTER — Ambulatory Visit (INDEPENDENT_AMBULATORY_CARE_PROVIDER_SITE_OTHER): Payer: 59 | Admitting: Family Medicine

## 2024-01-09 ENCOUNTER — Encounter: Payer: Self-pay | Admitting: Family Medicine

## 2024-01-09 VITALS — BP 110/76 | HR 61 | Temp 98.7°F | Ht 73.0 in | Wt 277.0 lb

## 2024-01-09 DIAGNOSIS — D696 Thrombocytopenia, unspecified: Secondary | ICD-10-CM | POA: Diagnosis not present

## 2024-01-09 DIAGNOSIS — Z0000 Encounter for general adult medical examination without abnormal findings: Secondary | ICD-10-CM

## 2024-01-09 NOTE — Progress Notes (Signed)
 Subjective:    Patient ID: Reginald Simmons, male    DOB: [birth year], 53 y.o.   MRN: [id number]  HPI Here for a well exam. He feels fine. He sees Cardiology regularly, and his BP has been stable. They checked labs on 11-28-23 and his LDL was 103 and his A1c was 6.0%.    Review of Systems  Constitutional: Negative.   HENT: Negative.    Eyes: Negative.   Respiratory: Negative.    Cardiovascular: Negative.   Gastrointestinal: Negative.   Genitourinary: Negative.   Musculoskeletal: Negative.   Skin: Negative.   Neurological: Negative.   Psychiatric/Behavioral: Negative.         Objective:   Physical Exam Constitutional:      General: He is not in acute distress.    Appearance: He is well-developed. He is obese. He is not diaphoretic.  HENT:     Head: Normocephalic and atraumatic.     Right Ear: External ear normal.     Left Ear: External ear normal.     Nose: Nose normal.     Mouth/Throat:     Pharynx: No oropharyngeal exudate.  Eyes:     General: No scleral icterus.       Right eye: No discharge.        Left eye: No discharge.     Conjunctiva/sclera: Conjunctivae normal.     Pupils: Pupils are equal, round, and reactive to light.  Neck:     Thyroid : No thyromegaly.     Vascular: No JVD.     Trachea: No tracheal deviation.  Cardiovascular:     Rate and Rhythm: Normal rate and regular rhythm.     Pulses: Normal pulses.     Heart sounds: Normal heart sounds. No murmur heard.    No friction rub. No gallop.  Pulmonary:     Effort: Pulmonary effort is normal. No respiratory distress.     Breath sounds: Normal breath sounds. No wheezing or rales.  Chest:     Chest wall: No tenderness.  Abdominal:     General: Bowel sounds are normal. There is no distension.     Palpations: Abdomen is soft. There is no mass.     Tenderness: There is no abdominal tenderness. There is no guarding or rebound.  Genitourinary:    Penis: Normal. No tenderness.      Testes: Normal.      Prostate: Normal.     Rectum: Normal. Guaiac result negative.  Musculoskeletal:        General: No tenderness. Normal range of motion.     Cervical back: Neck supple.  Lymphadenopathy:     Cervical: No cervical adenopathy.  Skin:    General: Skin is warm and dry.     Coloration: Skin is not pale.     Findings: No erythema or rash.  Neurological:     General: No focal deficit present.     Mental Status: He is alert and oriented to person, place, and time.     Cranial Nerves: No cranial nerve deficit.     Motor: No abnormal muscle tone.     Coordination: Coordination normal.     Deep Tendon Reflexes: Reflexes are normal and symmetric. Reflexes normal.  Psychiatric:        Mood and Affect: Mood normal.        Behavior: Behavior normal.        Thought Content: Thought content normal.        Judgment: Judgment normal.  Assessment & Plan:  Well exam. We discussed diet and exercise. Get fasting labs. Garnette Olmsted, MD

## 2024-01-10 LAB — CBC WITH DIFFERENTIAL/PLATELET
Absolute Lymphocytes: 1163 {cells}/uL (ref 850–3900)
Absolute Monocytes: 428 {cells}/uL (ref 200–950)
Basophils Absolute: 21 {cells}/uL (ref 0–200)
Basophils Relative: 0.5 %
Eosinophils Absolute: 29 {cells}/uL (ref 15–500)
Eosinophils Relative: 0.7 %
HCT: 46.5 % (ref 38.5–50.0)
Hemoglobin: 15.2 g/dL (ref 13.2–17.1)
MCH: 29.6 pg (ref 27.0–33.0)
MCHC: 32.7 g/dL (ref 32.0–36.0)
MCV: 90.6 fL (ref 80.0–100.0)
MPV: 12.9 fL — ABNORMAL HIGH (ref 7.5–12.5)
Monocytes Relative: 10.2 %
Neutro Abs: 2558 {cells}/uL (ref 1500–7800)
Neutrophils Relative %: 60.9 %
Platelets: 131 Thousand/uL — ABNORMAL LOW (ref 140–400)
RBC: 5.13 Million/uL (ref 4.20–5.80)
RDW: 13.4 % (ref 11.0–15.0)
Total Lymphocyte: 27.7 %
WBC: 4.2 Thousand/uL (ref 3.8–10.8)

## 2024-01-10 LAB — BASIC METABOLIC PANEL WITH GFR
BUN: 20 mg/dL (ref 7–25)
CO2: 29 mmol/L (ref 20–32)
Calcium: 9.3 mg/dL (ref 8.6–10.3)
Chloride: 100 mmol/L (ref 98–110)
Creat: 0.89 mg/dL (ref 0.70–1.30)
Glucose, Bld: 89 mg/dL (ref 65–99)
Potassium: 4.3 mmol/L (ref 3.5–5.3)
Sodium: 138 mmol/L (ref 135–146)
eGFR: 104 mL/min/1.73m2 (ref >=60)

## 2024-01-10 LAB — HEPATIC FUNCTION PANEL
AG Ratio: 1.5 (calc) (ref 1.0–2.5)
ALT: 15 U/L (ref 9–46)
AST: 18 U/L (ref 10–35)
Albumin: 4.6 g/dL (ref 3.6–5.1)
Alkaline phosphatase (APISO): 41 U/L (ref 35–144)
Bilirubin, Direct: 0.2 mg/dL (ref 0.0–0.2)
Globulin: 3.1 g/dL (ref 1.9–3.7)
Indirect Bilirubin: 0.6 mg/dL (ref 0.2–1.2)
Total Bilirubin: 0.8 mg/dL (ref 0.2–1.2)
Total Protein: 7.7 g/dL (ref 6.1–8.1)

## 2024-01-10 LAB — TSH: TSH: 0.97 m[IU]/L (ref 0.40–4.50)

## 2024-01-10 LAB — PSA: PSA: 2.49 ng/mL (ref <=4.00)

## 2024-01-12 ENCOUNTER — Other Ambulatory Visit: Payer: Self-pay | Admitting: Family Medicine

## 2024-01-12 ENCOUNTER — Ambulatory Visit: Payer: Self-pay | Admitting: Family Medicine

## 2024-02-27 ENCOUNTER — Ambulatory Visit: Attending: Cardiology | Admitting: Pharmacist Clinician (PhC)/ Clinical Pharmacy Specialist

## 2024-02-27 ENCOUNTER — Encounter: Payer: Self-pay | Admitting: Pharmacist Clinician (PhC)/ Clinical Pharmacy Specialist

## 2024-02-27 ENCOUNTER — Telehealth: Payer: Self-pay | Admitting: Pharmacist Clinician (PhC)/ Clinical Pharmacy Specialist

## 2024-02-27 VITALS — Ht 73.0 in | Wt 286.6 lb

## 2024-02-27 DIAGNOSIS — E669 Obesity, unspecified: Secondary | ICD-10-CM | POA: Diagnosis not present

## 2024-02-27 NOTE — Progress Notes (Signed)
 "    Office Visit    Patient Name: Reginald Simmons Date of Encounter: 02/27/2024  Primary Care Provider:  Johnny Garnette LABOR, MD Primary Cardiologist:  Candyce Reek, MD  Chief Complaint    Weight management  Significant Past Medical History   HTN Controlled, amlodipine , losartan ; decreased chlorthalidone  to 12.5 mg at last visit;   HLD Familial - LDL at 201 in 2014 9/25 LDL 103 on rosuvastatin  40  preDM 11/25 A1c 6.2          Allergies[1]  History of Present Illness    Reginald Simmons is a 53 y.o. male patient of Orren Fabry PA, in the office today to discuss options for weight management.  Patient reports that his highest weight was about 325 in 2018.  He started a regular exercise regimen and changed his eating habits.  Has dropped down to 277 at home (286 in the office today), but can't seem to get weight down any further.     Current meds that may affect weight: none  Baseline weight/BMI: 130 kg // 37.82  Insurance payor: Arts development officer  Diet: plant based, eats at [employer], plenty of raw vegetables, only occasional lean meats - salmon, chicken Lunch around 9-10 am; makes own juice beet based, with frozen fruit medley (cherry, pomegranate, raspberry , blueberry, strawberry, adds banana (green); will add vegetable medley at times  Exercise: 4 days per week at work (works for [employer]).  20 minutes on assault bike, then 20 minutes on stair climber.  Does occasional strength training with bands/machines.   Family History: mother with CAD in her 71's, father had CVA at 4; 4 siblings, no heart disease  Social History:   Tobacco: no  Alcohol:some on week, beer and wine mostly  (ultra lite beer)  Caffeine: none   Accessory Clinical Findings    Lab Results  Component Value Date   CREATININE 0.89 01/09/2024   BUN 20 01/09/2024   NA 138 01/09/2024   K 4.3 01/09/2024   CL 100 01/09/2024   CO2 29 01/09/2024   Lab Results  Component Value Date   ALT 15  01/09/2024   AST 18 01/09/2024   ALKPHOS 50 01/03/2023   BILITOT 0.8 01/09/2024   Lab Results  Component Value Date   HGBA1C 6.0 (H) 11/28/2023      Home Medications/Allergies    Current Outpatient Medications  Medication Sig Dispense Refill   amLODipine  (NORVASC ) 5 MG tablet Take 1 tablet (5 mg total) by mouth daily. 90 tablet 3   cetirizine (ZYRTEC) 10 MG tablet Take 10 mg by mouth daily.     chlorthalidone  (HYGROTON ) 25 MG tablet Take 0.5 tablets (12.5 mg total) by mouth daily. 90 tablet 3   losartan  (COZAAR ) 25 MG tablet Take 1 tablet (25 mg total) by mouth daily. 90 tablet 3   potassium chloride  SA (KLOR-CON  M) 20 MEQ tablet TAKE 1 TABLET(20 MEQ) BY MOUTH DAILY 90 tablet 3   rosuvastatin  (CRESTOR ) 40 MG tablet Take 1 tablet (40 mg total) by mouth daily. 90 tablet 3   No current facility-administered medications for this visit.     Allergies[2]  Assessment & Plan    Obesity (BMI 30-39.9)  Patient has not met goal of at least 5% of body weight loss with comprehensive lifestyle modifications alone in the past 3-6 months. Pharmacotherapy is appropriate to pursue as augmentation. Will start Zepbound or [id number].   Advised patient on common side effects including nausea, diarrhea, dyspepsia, decreased appetite, and  fatigue. Counseled patient on reducing meal size and how to titrate medication to minimize side effects. Patient aware to call if intolerable side effects or if experiencing dehydration, abdominal pain, or dizziness. Patient will adhere to dietary modifications and will target at least 150 minutes of moderate intensity exercise weekly, plus resistance training twice a week (as recommended by the American Heart Association). This resistance training--such as weightlifting, bodyweight exercises, or using resistance bands, adapted to the patient's ability--will help prevent muscle loss.  Injection technique reviewed at today's visit.    Follow up in 1-2 days regarding  coverage of Zepbound or Wegovy . If therapy is initiated, phone or MyChart follow-ups will be conducted every 4 weeks for dose titration until the patient reaches the effective therapeutic dose and target weight.   Braylyn Eye PharmD CPP [facility]  [address] [place], [place] [address] [phone]      [1] No Known Allergies [2] No Known Allergies  "

## 2024-02-27 NOTE — Patient Instructions (Signed)
 We will start the prior authorization process to get Zepbound or Wegovy covered by your insurance.   TIPS FOR SUCCESS Write down the reasons why you want to lose weight and post it in a place where you'll see it often. Start small and work your way up. Keep in mind that it takes time to achieve goals, and small steps add up. Any additional movements help to burn calories. Taking the stairs rather than the elevator and parking at the far end of your parking lot are easy ways to start. Brisk walking for at least 30 minutes 4 or more days of the week is an excellent goal to work toward  [employer] WHAT IT MEANS TO FEEL FULL Did you know that it can take 15 minutes or more for your brain to receive the message that you've eaten? That means that, if you eat less food, but consume it slower, you may still feel satisfied. Eating a lot of fruits and vegetables can also help you feel fuller. Eat off of smaller plates so that moderate portions don't seem too small  TITRATION PLAN Will plan to follow the titration plan as below, pending patient is tolerating each dose before increasing to the next. Can slow titration if needed for tolerability.    Follow up in via MyChart at the 4th dose of each strength.  If you have any questions or concerns, please reach out to us .  Bernadene Garside - MyChart is best or call [phone] .  THANK YOU FOR CHOOSING [facility]

## 2024-02-27 NOTE — Assessment & Plan Note (Signed)
" °  Patient has not met goal of at least 5% of body weight loss with comprehensive lifestyle modifications alone in the past 3-6 months. Pharmacotherapy is appropriate to pursue as augmentation. Will start Zepbound or [id number].   Advised patient on common side effects including nausea, diarrhea, dyspepsia, decreased appetite, and fatigue. Counseled patient on reducing meal size and how to titrate medication to minimize side effects. Patient aware to call if intolerable side effects or if experiencing dehydration, abdominal pain, or dizziness. Patient will adhere to dietary modifications and will target at least 150 minutes of moderate intensity exercise weekly, plus resistance training twice a week (as recommended by the American Heart Association). This resistance training--such as weightlifting, bodyweight exercises, or using resistance bands, adapted to the patient's ability--will help prevent muscle loss.  Injection technique reviewed at today's visit.    Follow up in 1-2 days regarding coverage of Zepbound or Wegovy . If therapy is initiated, phone or MyChart follow-ups will be conducted every 4 weeks for dose titration until the patient reaches the effective therapeutic dose and target weight.  "

## 2024-02-27 NOTE — Telephone Encounter (Signed)
 Please try PA for Zepbound or Wegovy.  Weight loss only dx.

## 2024-03-01 ENCOUNTER — Other Ambulatory Visit (HOSPITAL_COMMUNITY): Payer: Self-pay

## 2024-03-01 NOTE — Telephone Encounter (Signed)
 Pharmacy Patient Advocate Encounter   Received notification from Physician's Office that prior authorization for wegovy is required/requested.   Insurance verification completed.   The patient is insured through [employer].   Per test claim: Per test claim, medication is not covered due to plan/benefit exclusion, PA not submitted at this time

## 2024-03-23 MED ORDER — WEGOVY 1.5 MG PO TABS: 1.5000 mg | ORAL_TABLET | Freq: Every day | ORAL | 0 refills | Status: AC

## 2024-03-23 NOTE — Addendum Note (Signed)
 Addended by: Shaniece Bussa L on: 03/23/2024 07:58 AM   Modules accepted: Orders

## 2025-01-14 ENCOUNTER — Encounter: Admitting: Family Medicine
